# Patient Record
Sex: Female | Born: 1950 | Hispanic: No | Marital: Married | Smoking: Never smoker
Health system: Southern US, Community
[De-identification: ages and names within clinical notes are randomized; demographics above are authoritative.]

## PROBLEM LIST (undated history)

## (undated) DIAGNOSIS — M1990 Unspecified osteoarthritis, unspecified site: Secondary | ICD-10-CM

## (undated) DIAGNOSIS — T7840XA Allergy, unspecified, initial encounter: Secondary | ICD-10-CM

## (undated) DIAGNOSIS — C541 Malignant neoplasm of endometrium: Secondary | ICD-10-CM

## (undated) DIAGNOSIS — Z86010 Personal history of colonic polyps: Secondary | ICD-10-CM

## (undated) HISTORY — DX: Personal history of colonic polyps: Z86.010

## (undated) HISTORY — PX: ABDOMINAL HYSTERECTOMY: SHX81

## (undated) HISTORY — DX: Allergy, unspecified, initial encounter: T78.40XA

## (undated) HISTORY — PX: TOTAL VAGINAL HYSTERECTOMY: SHX2548

## (undated) HISTORY — DX: Malignant neoplasm of endometrium: C54.1

## (undated) HISTORY — DX: Unspecified osteoarthritis, unspecified site: M19.90

---

## 2009-04-26 ENCOUNTER — Ambulatory Visit: Payer: Self-pay | Admitting: Internal Medicine

## 2009-04-26 ENCOUNTER — Other Ambulatory Visit: Admission: RE | Admit: 2009-04-26 | Discharge: 2009-04-26 | Payer: Self-pay | Admitting: Internal Medicine

## 2009-05-28 ENCOUNTER — Ambulatory Visit: Admission: RE | Admit: 2009-05-28 | Discharge: 2009-05-28 | Payer: Self-pay | Admitting: Gynecology

## 2010-12-29 ENCOUNTER — Encounter: Payer: Self-pay | Admitting: Internal Medicine

## 2010-12-29 ENCOUNTER — Ambulatory Visit (INDEPENDENT_AMBULATORY_CARE_PROVIDER_SITE_OTHER): Payer: BC Managed Care – PPO | Admitting: Internal Medicine

## 2010-12-29 VITALS — BP 121/68 | HR 72 | Temp 98.9°F | Ht 63.0 in | Wt 214.0 lb

## 2010-12-29 DIAGNOSIS — Z131 Encounter for screening for diabetes mellitus: Secondary | ICD-10-CM

## 2010-12-29 DIAGNOSIS — Z0000 Encounter for general adult medical examination without abnormal findings: Secondary | ICD-10-CM

## 2010-12-29 DIAGNOSIS — E669 Obesity, unspecified: Secondary | ICD-10-CM

## 2010-12-29 DIAGNOSIS — Z23 Encounter for immunization: Secondary | ICD-10-CM

## 2010-12-29 DIAGNOSIS — C55 Malignant neoplasm of uterus, part unspecified: Secondary | ICD-10-CM

## 2010-12-29 LAB — POCT URINALYSIS DIPSTICK
Ketones, UA: NEGATIVE
Leukocytes, UA: NEGATIVE
Protein, UA: NEGATIVE
Urobilinogen, UA: NEGATIVE

## 2010-12-29 LAB — CBC WITH DIFFERENTIAL/PLATELET
Basophils Relative: 0 % (ref 0–1)
Hemoglobin: 14.7 g/dL (ref 12.0–15.0)
Lymphocytes Relative: 28 % (ref 12–46)
Lymphs Abs: 1.3 10*3/uL (ref 0.7–4.0)
Monocytes Relative: 9 % (ref 3–12)
Neutro Abs: 2.9 10*3/uL (ref 1.7–7.7)
Neutrophils Relative %: 60 % (ref 43–77)
RBC: 4.67 MIL/uL (ref 3.87–5.11)
WBC: 4.8 10*3/uL (ref 4.0–10.5)

## 2010-12-29 LAB — COMPREHENSIVE METABOLIC PANEL
Albumin: 4.4 g/dL (ref 3.5–5.2)
Alkaline Phosphatase: 90 U/L (ref 39–117)
CO2: 28 mEq/L (ref 19–32)
Chloride: 105 mEq/L (ref 96–112)
Glucose, Bld: 87 mg/dL (ref 70–99)
Potassium: 4.1 mEq/L (ref 3.5–5.3)
Sodium: 142 mEq/L (ref 135–145)
Total Protein: 7.1 g/dL (ref 6.0–8.3)

## 2010-12-29 LAB — LIPID PANEL
Cholesterol: 165 mg/dL (ref 0–200)
VLDL: 21 mg/dL (ref 0–40)

## 2010-12-29 MED ORDER — TETANUS-DIPHTH-ACELL PERTUSSIS 5-2.5-18.5 LF-MCG/0.5 IM SUSP
0.5000 mL | Freq: Once | INTRAMUSCULAR | Status: AC
  Administered 2010-12-29: 0.5 mL via INTRAMUSCULAR

## 2010-12-29 NOTE — Progress Notes (Signed)
  Subjective:    Patient ID: Audrey Yoder, female    DOB: May 07, 1951, 60 y.o.   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 2 pregnancies. Family history remarkable for maternal and with ovarian cancer. Patient had abnormal vaginal bleeding for at least one year before seeking attention. Previous weight November 2010 was 227 pounds she's lost proximally 13 pounds. Subsequently underwent a modified radical hysterectomy with bilateral salpingo-oophorectomy and bilateral lymph node dissection 06/21/2009 by Dr. Conley Rolls at [facility]. Pathology showed grade 2 histology, negative nodes, 86% depth of invasion of the uterine wall. Overall her surgical staging was I-B. patient now here for general physical examination. Continues to be followed at [facility] for GYN care. Patient says it's been about 10 years since she last had a [facility] Have asked her to call and check on that. With regard to health maintenance, we gave her a DTaP and the shingles vaccine today. Says she has had a recent mammogram. Last one on file here in this office was from July 2011.    Review of Systems  Constitutional: Negative.   HENT: Negative.   Eyes: Negative.   Respiratory: Negative.   Cardiovascular: Negative.   Gastrointestinal: Negative.   Genitourinary: Negative.   Musculoskeletal: Negative.   Neurological: Negative.   Hematological: Negative.   Psychiatric/Behavioral: Negative.        Objective:   Physical Exam  Constitutional: She is oriented to person, place, and time. She appears well-nourished. No distress.  HENT:  Head: Normocephalic and atraumatic.  Right Ear: External ear normal.  Left Ear: External ear normal.  Mouth/Throat: Oropharynx is clear and moist. No oropharyngeal exudate.  Eyes: EOM are  normal. Pupils are equal, round, and reactive to light. Right eye exhibits no discharge. Left eye exhibits no discharge. No scleral icterus.  Neck: Normal range of motion. Neck supple. No JVD present. No thyromegaly present.  Cardiovascular: Normal rate, regular rhythm and normal heart sounds.   No murmur heard. Pulmonary/Chest: Effort normal and breath sounds normal. No respiratory distress. She has no wheezes. She has no rales. She exhibits no tenderness.       Breasts without masses  Abdominal: Soft. Bowel sounds are normal. She exhibits no distension and no mass. There is no tenderness. There is no rebound and no guarding.  Musculoskeletal: She exhibits no edema.  Lymphadenopathy:    She has no cervical adenopathy.  Neurological: She is alert and oriented to person, place, and time. She has normal reflexes. She displays normal reflexes. No cranial nerve deficit. She exhibits normal muscle tone. Coordination normal.  Skin:       Insect bites lower legs  Psychiatric: She has a normal mood and affect. Her behavior is normal. Judgment and thought content normal.          Assessment & Plan:  History of stage I-B uterine cancer status post surgery January 2011 followed by GYN oncology at [facility]  Obesity  Health maintenance : DTaP and Zostavax vaccines given today. Patient needs to have repeat colonoscopy and will consider it. Return one year or as needed pending review of fasting lab work

## 2010-12-29 NOTE — Patient Instructions (Signed)
Please try the diet exercise and lose some weight.

## 2011-01-06 ENCOUNTER — Encounter: Payer: Self-pay | Admitting: Internal Medicine

## 2011-03-31 ENCOUNTER — Encounter: Payer: Self-pay | Admitting: Internal Medicine

## 2011-04-04 DIAGNOSIS — C549 Malignant neoplasm of corpus uteri, unspecified: Secondary | ICD-10-CM | POA: Insufficient documentation

## 2011-08-02 ENCOUNTER — Encounter: Payer: Self-pay | Admitting: Internal Medicine

## 2011-09-08 ENCOUNTER — Ambulatory Visit (INDEPENDENT_AMBULATORY_CARE_PROVIDER_SITE_OTHER): Payer: BC Managed Care – PPO | Admitting: Internal Medicine

## 2011-09-08 VITALS — BP 142/78 | HR 74 | Temp 97.8°F | Resp 16 | Wt 190.0 lb

## 2011-09-08 DIAGNOSIS — H6593 Unspecified nonsuppurative otitis media, bilateral: Secondary | ICD-10-CM

## 2011-09-08 DIAGNOSIS — J329 Chronic sinusitis, unspecified: Secondary | ICD-10-CM

## 2011-09-08 DIAGNOSIS — H6590 Unspecified nonsuppurative otitis media, unspecified ear: Secondary | ICD-10-CM

## 2011-09-09 ENCOUNTER — Encounter: Payer: Self-pay | Admitting: Internal Medicine
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
## 2011-09-09 NOTE — Patient Instructions (Addendum)
Take Levaquin 500 milligrams daily with a meal for 10 days. Call if not better in 10 days or sooner if worse.

## 2011-09-19 ENCOUNTER — Telehealth: Payer: Self-pay | Admitting: Internal Medicine

## 2011-09-19 DIAGNOSIS — H6690 Otitis media, unspecified, unspecified ear: Secondary | ICD-10-CM

## 2011-09-19 MED ORDER — AZITHROMYCIN 250 MG PO TABS: ORAL_TABLET | ORAL | Status: AC

## 2011-09-19 NOTE — Telephone Encounter (Signed)
Call in Zithromax Z Pak (6 tabs) Sig: Two po day one followed by on po days 2-5. See if not better in one week. Says previous antibiotic caused joint pain.

## 2011-12-22 ENCOUNTER — Other Ambulatory Visit: Payer: Self-pay | Admitting: Internal Medicine

## 2011-12-22 ENCOUNTER — Other Ambulatory Visit: Payer: BC Managed Care – PPO | Admitting: Internal Medicine

## 2011-12-22 DIAGNOSIS — Z0000 Encounter for general adult medical examination without abnormal findings: Secondary | ICD-10-CM

## 2011-12-22 DIAGNOSIS — Z8543 Personal history of malignant neoplasm of ovary: Secondary | ICD-10-CM

## 2011-12-22 LAB — CBC WITH DIFFERENTIAL/PLATELET
Basophils Absolute: 0 10*3/uL (ref 0.0–0.1)
Eosinophils Relative: 3 % (ref 0–5)
HCT: 40.9 % (ref 36.0–46.0)
Lymphocytes Relative: 25 % (ref 12–46)
MCHC: 35 g/dL (ref 30.0–36.0)
MCV: 88.1 fL (ref 78.0–100.0)
Monocytes Absolute: 0.3 10*3/uL (ref 0.1–1.0)
RDW: 13.2 % (ref 11.5–15.5)
WBC: 4.1 10*3/uL (ref 4.0–10.5)

## 2011-12-23 LAB — LIPID PANEL
Cholesterol: 160 mg/dL (ref 0–200)
HDL: 43 mg/dL (ref >39)
Total CHOL/HDL Ratio: 3.7 Ratio
Triglycerides: 83 mg/dL (ref <150)

## 2011-12-23 LAB — TSH: TSH: 1.603 u[IU]/mL (ref 0.350–4.500)

## 2011-12-23 LAB — COMPREHENSIVE METABOLIC PANEL
AST: 19 U/L (ref 0–37)
BUN: 17 mg/dL (ref 6–23)
Calcium: 8.9 mg/dL (ref 8.4–10.5)
Chloride: 106 mEq/L (ref 96–112)
Creat: 0.68 mg/dL (ref 0.50–1.10)
Glucose, Bld: 79 mg/dL (ref 70–99)

## 2011-12-25 ENCOUNTER — Ambulatory Visit (INDEPENDENT_AMBULATORY_CARE_PROVIDER_SITE_OTHER): Payer: BC Managed Care – PPO | Admitting: Internal Medicine

## 2011-12-25 VITALS — BP 130/72 | HR 76 | Temp 99.1°F | Ht 64.25 in | Wt 208.0 lb

## 2011-12-25 DIAGNOSIS — E7800 Pure hypercholesterolemia, unspecified: Secondary | ICD-10-CM

## 2011-12-25 DIAGNOSIS — Z87898 Personal history of other specified conditions: Secondary | ICD-10-CM

## 2011-12-25 DIAGNOSIS — Z8542 Personal history of malignant neoplasm of other parts of uterus: Secondary | ICD-10-CM

## 2011-12-25 DIAGNOSIS — Z0000 Encounter for general adult medical examination without abnormal findings: Secondary | ICD-10-CM

## 2011-12-25 DIAGNOSIS — Z8639 Personal history of other endocrine, nutritional and metabolic disease: Secondary | ICD-10-CM

## 2011-12-25 DIAGNOSIS — E669 Obesity, unspecified: Secondary | ICD-10-CM

## 2011-12-25 LAB — POCT URINALYSIS DIPSTICK
Bilirubin, UA: NEGATIVE
Glucose, UA: NEGATIVE
Ketones, UA: NEGATIVE
Spec Grav, UA: >=1.03

## 2011-12-25 LAB — HEMOGLOBIN A1C: Mean Plasma Glucose: 108 mg/dL (ref <117)

## 2012-01-13 ENCOUNTER — Encounter: Payer: Self-pay | Admitting: Internal Medicine

## 2012-01-13 DIAGNOSIS — E7800 Pure hypercholesterolemia, unspecified: Secondary | ICD-10-CM | POA: Insufficient documentation

## 2012-01-13 DIAGNOSIS — Z8639 Personal history of other endocrine, nutritional and metabolic disease: Secondary | ICD-10-CM | POA: Insufficient documentation

## 2012-01-13 DIAGNOSIS — Z8542 Personal history of malignant neoplasm of other parts of uterus: Secondary | ICD-10-CM | POA: Insufficient documentation

## 2012-01-13 NOTE — Progress Notes (Signed)
Subjective:    Patient ID: Audrey Yoder, female    DOB: [birth year], 61 y.o.   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 She has a history of uterine cancer. Patient underwent a modified radical hysterectomy with BSO and bilateral pelvic lymph node dissection 06/21/2009 by Dr. Conley Rolls, GYN oncologist at [facility]. Pathology showed grade 2 histology, negative nodes in an 86% depth of invasion of the uterine wall. Overall her surgical staging was 1 be. Patient had vaginal cuff brachii therapy 3 doses one month apart as adjuvant therapy. Continues to be followed at [facility].  Patient had Zostavax vaccine 12/29/2010 and Tdap Vaccine 12/29/2010. She is allergic to sulfa. History of left knee osteoarthritis 2008 with steroid injection number orthopedist. Patient says she had a colonoscopy or sigmoidoscopy perhaps in 2003 at [facility]. However, they told us by phone they did not have anything on file from a number of years ago.  Social history: She has a JD degree and is married. She is self-employed. Does not smoke. Drinks wine infrequently. Has 2 adult sons.  Family history: Father age 84 with history of myeloproliferative disorder. Mother age 70 with history of stroke. One brother with history of hemochromatosis.  Patient has history of vitamin D deficiency in 2010. Her level at that time was 11. She was placed on high-dose vitamin D for 12 weeks and advised take 2000 units daily. History of LDL cholesterol level of 127 in 2010. Patient had mammogram July 2011 which was negative.      Review of Systems  Constitutional: Negative.   HENT: Negative.   Eyes: Negative.   Respiratory: Negative.   Cardiovascular: Negative.   Gastrointestinal: Negative.   Genitourinary: Negative.   Musculoskeletal: Negative.   Neurological: Negative.   Hematological: Negative.   Psychiatric/Behavioral: Negative.          Objective:   Physical Exam  Vitals reviewed. Constitutional: She is oriented to person, place, and time. She appears well-developed and well-nourished. No distress.  HENT:  Head: Normocephalic and atraumatic.  Right Ear: External ear normal.  Left Ear: External ear normal.  Mouth/Throat: Oropharynx is clear and moist. No oropharyngeal exudate.  Eyes: Conjunctivae and EOM are normal. Pupils are equal, round, and reactive to light. Right eye exhibits no discharge. Left eye exhibits no discharge. No scleral icterus.  Neck: Neck supple. No JVD present. No thyromegaly present.  Cardiovascular: Normal rate, regular rhythm, normal heart sounds and intact distal pulses.   No murmur heard. Pulmonary/Chest: Effort normal and breath sounds normal. No respiratory distress. She has no wheezes. She has no rales.       Breasts normal female  Abdominal: Soft. Bowel sounds are normal. She exhibits no distension and no mass. There is no tenderness. There is no rebound.  Genitourinary:       Deferred to GYN oncology  Musculoskeletal: Normal range of motion. She exhibits no edema.  Lymphadenopathy:    She has no cervical adenopathy.  Neurological: She is alert and oriented to person, place, and time. No cranial nerve deficit. Coordination normal.  Skin: Skin is warm and dry. She is not diaphoretic.  Psychiatric: She has a normal mood and affect. Her behavior is normal. Judgment and thought content normal.          Assessment & Plan:  History of uterine cancer status post hysterectomy BSO and lymph node dissection at [facility] by Dr.  Jamal Collin oncologist 2011  History of moderately elevated LDL  History of obesity  History of vitamin D deficiency  Plan: Reminded patient regarding colonoscopy. Given 3 Hemoccult cards and asked her to send these back if she does not want to do colonoscopy at the present time. Return in one year or as needed. Reminded regarding annual mammogram.

## 2012-01-13 NOTE — Patient Instructions (Addendum)
Continue calcium and vitamin D supplement. Return in one year. Try the diet exercise and lose weight. Consider colonoscopy. Reminded re: annual mammogram

## 2012-03-25 ENCOUNTER — Encounter: Payer: Self-pay | Admitting: Internal Medicine

## 2012-12-06 ENCOUNTER — Telehealth: Payer: Self-pay

## 2012-12-06 NOTE — Telephone Encounter (Signed)
Patient states she thinks she may have an URI because she is achy and has a sore throat, but then also remembers she had a tick bite approx 30 days ago. Says it was a small deer tick. Thinking she may have Lyme disease. Advised [facility] today, since Dr. Lenord Fellers is out of the office. She is agreeable

## 2013-01-14 ENCOUNTER — Other Ambulatory Visit: Payer: BC Managed Care – PPO | Admitting: Internal Medicine

## 2013-01-14 DIAGNOSIS — Z13220 Encounter for screening for lipoid disorders: Secondary | ICD-10-CM

## 2013-01-14 DIAGNOSIS — Z1329 Encounter for screening for other suspected endocrine disorder: Secondary | ICD-10-CM

## 2013-01-14 DIAGNOSIS — Z130 Encounter for screening for diseases of the blood and blood-forming organs and certain disorders involving the immune mechanism: Secondary | ICD-10-CM

## 2013-01-14 DIAGNOSIS — E559 Vitamin D deficiency, unspecified: Secondary | ICD-10-CM

## 2013-01-14 DIAGNOSIS — Z0000 Encounter for general adult medical examination without abnormal findings: Secondary | ICD-10-CM

## 2013-01-14 LAB — CBC WITH DIFFERENTIAL/PLATELET
Basophils Absolute: 0 10*3/uL (ref 0.0–0.1)
Eosinophils Relative: 3 % (ref 0–5)
HCT: 41.1 % (ref 36.0–46.0)
Lymphocytes Relative: 25 % (ref 12–46)
Lymphs Abs: 1.1 10*3/uL (ref 0.7–4.0)
MCV: 88.4 fL (ref 78.0–100.0)
Monocytes Absolute: 0.5 10*3/uL (ref 0.1–1.0)
Neutro Abs: 2.8 10*3/uL (ref 1.7–7.7)
Platelets: 248 10*3/uL (ref 150–400)
RBC: 4.65 MIL/uL (ref 3.87–5.11)
RDW: 13.6 % (ref 11.5–15.5)
WBC: 4.5 10*3/uL (ref 4.0–10.5)

## 2013-01-14 LAB — COMPREHENSIVE METABOLIC PANEL
ALT: 18 U/L (ref 0–35)
AST: 15 U/L (ref 0–37)
Albumin: 4.2 g/dL (ref 3.5–5.2)
CO2: 29 mEq/L (ref 19–32)
Calcium: 9.2 mg/dL (ref 8.4–10.5)
Chloride: 107 mEq/L (ref 96–112)
Creat: 0.7 mg/dL (ref 0.50–1.10)
Potassium: 4.1 mEq/L (ref 3.5–5.3)

## 2013-01-14 LAB — LIPID PANEL: Cholesterol: 174 mg/dL (ref 0–200)

## 2013-01-14 LAB — TSH: TSH: 1.193 u[IU]/mL (ref 0.350–4.500)

## 2013-01-15 LAB — VITAMIN D 25 HYDROXY (VIT D DEFICIENCY, FRACTURES): Vit D, 25-Hydroxy: 48 ng/mL (ref 30–89)

## 2013-01-16 ENCOUNTER — Other Ambulatory Visit: Payer: Self-pay | Admitting: Internal Medicine

## 2013-01-17 ENCOUNTER — Encounter: Payer: Self-pay | Admitting: Internal Medicine

## 2013-01-17 ENCOUNTER — Ambulatory Visit (INDEPENDENT_AMBULATORY_CARE_PROVIDER_SITE_OTHER): Payer: BC Managed Care – PPO | Admitting: Internal Medicine

## 2013-01-17 VITALS — BP 126/76 | HR 84 | Ht 62.5 in | Wt 222.0 lb

## 2013-01-17 DIAGNOSIS — Z8542 Personal history of malignant neoplasm of other parts of uterus: Secondary | ICD-10-CM

## 2013-01-17 DIAGNOSIS — E669 Obesity, unspecified: Secondary | ICD-10-CM

## 2013-01-17 DIAGNOSIS — G5710 Meralgia paresthetica, unspecified lower limb: Secondary | ICD-10-CM

## 2013-01-17 DIAGNOSIS — G5712 Meralgia paresthetica, left lower limb: Secondary | ICD-10-CM

## 2013-01-17 DIAGNOSIS — E785 Hyperlipidemia, unspecified: Secondary | ICD-10-CM

## 2013-01-17 DIAGNOSIS — N390 Urinary tract infection, site not specified: Secondary | ICD-10-CM

## 2013-01-17 DIAGNOSIS — Z0000 Encounter for general adult medical examination without abnormal findings: Secondary | ICD-10-CM

## 2013-01-17 DIAGNOSIS — Z8639 Personal history of other endocrine, nutritional and metabolic disease: Secondary | ICD-10-CM

## 2013-01-17 LAB — POCT URINALYSIS DIPSTICK
Bilirubin, UA: NEGATIVE
Glucose, UA: NEGATIVE
Nitrite, UA: NEGATIVE
Urobilinogen, UA: NEGATIVE

## 2013-01-17 NOTE — Patient Instructions (Addendum)
It was a pleasure to see you. Return in one year. Diet exercise and lose weight. Please have colonoscopy and mammogram.

## 2013-01-18 ENCOUNTER — Encounter: Payer: Self-pay | Admitting: Internal Medicine

## 2013-01-18 NOTE — Progress Notes (Signed)
Subjective:    Patient ID: Audrey Yoder, female    DOB: 08-04-1950, 62 y.o.   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 History of uterine cancer. Patient underwent a modified radical hysterectomy with BSO and bilateral pelvic lymph node dissection January 2011 at [facility]. Pathology showed grade 2 histology. Had negative nodes and 86% depth invasion of the uterine wall. Overall her surgery staging was 1-B. Patient had vaginal cuff brachii therapy 3 doses one month apart his adjuvant therapy. Continues to be followed at [facility].  Had Zostavax vaccine July 2012, tetanus vaccine July 2012.  She is allergic to Sulfa.  Patient says she had colonoscopy or sigmoidoscopy around 2003 at [facility] but we have not been able to locate those records. However we do see  In EPIC a recall letter from Dr. Lina Sar dated February 2013  History of left knee osteoarthritis 2008 with steroid injection by orthopedist.  Social history: She has a JD degree and is married. She is self-employed. Does not smoke. Drinks wine infrequently. Has 2 adult sons.  History of vitamin D deficiency in 2010. At that time her level was 11. History of LDL cholesterol of 127 in 4098.  Patient had mammogram July 2011 which was negative.   Family history: Father with history of myeloproliferative disorder. Mother with history of stroke. One brother with history of hemochromatosis.  Recently patient was treated at an urgent care center in [place] for a presumed tickborne illness with doxycycline. Patient had flulike symptoms and reportedly had a recent tick bite.     Review of Systems  Constitutional: Negative.   HENT: Negative.   Eyes: Negative.   Respiratory: Negative.   Cardiovascular: Negative.   Gastrointestinal: Negative.   Endocrine: Negative.   Genitourinary: Negative.   Allergic/Immunologic: Negative.    Neurological:       Complaining of some numbness and tingling left lateral leg with prolonged standing  Hematological: Negative.   Psychiatric/Behavioral: Negative.        Objective:   Physical Exam  Vitals reviewed. Constitutional: She is oriented to person, place, and time. She appears well-developed and well-nourished. No distress.  HENT:  Head: Normocephalic and atraumatic.  Right Ear: External ear normal.  Left Ear: External ear normal.  Mouth/Throat: Oropharynx is clear and moist. No oropharyngeal exudate.  Eyes: Conjunctivae and EOM are normal. Pupils are equal, round, and reactive to light. Right eye exhibits no discharge. Left eye exhibits no discharge. No scleral icterus.  Neck: Neck supple. No JVD present. No tracheal deviation present. No thyromegaly present.  Cardiovascular: Normal rate, regular rhythm, normal heart sounds and intact distal pulses.   No murmur heard. Pulmonary/Chest: Effort normal and breath sounds normal. No respiratory distress. She has no wheezes. She has no rales. She exhibits no tenderness.  Breasts normal female  Abdominal: Soft. Bowel sounds are normal. She exhibits no distension and no mass. There is no tenderness. There is no rebound and no guarding.  Genitourinary:  Deferred to [facility]YN oncology  Musculoskeletal: Normal range of motion. She exhibits no edema.  Lymphadenopathy:    She has no cervical adenopathy.  Neurological: She is alert and oriented to person, place, and time. She has normal reflexes. No cranial nerve deficit. Coordination normal.  Skin: Skin is warm and dry. No rash noted. She is not diaphoretic.  Psychiatric: She has a normal mood and affect. Her behavior is normal. Judgment and  thought content normal.          Assessment & Plan:  Obesity-talk with patient about diet and exercise  Numbness left lateral leg consistent with meralgia paresthetica  Hyperlipidemia-recommend diet and exercise.  Recently  treated for presumed tickborne illness with doxycycline-do not have records on this visit to urgent care in [place]  History of uterine cancer-followed at [facility]  History of vitamin D deficiency  Abnormal urinalysis-urine culture done. Patient asymptomatic.  Plan: Return in one year or as needed. Reassure her regarding benign condition with numbness left lateral leg involving lateral  femoral cutaneous nerve with prolonged standing  Reminded about mammogram and colonoscopy. Last mammogram on file 2012. Suggest bone density study. This

## 2013-01-19 LAB — URINE CULTURE: Colony Count: 75000

## 2013-01-20 NOTE — Progress Notes (Signed)
Patient informed,

## 2013-03-04 ENCOUNTER — Ambulatory Visit (INDEPENDENT_AMBULATORY_CARE_PROVIDER_SITE_OTHER): Payer: BC Managed Care – PPO | Admitting: Internal Medicine

## 2013-03-04 ENCOUNTER — Encounter: Payer: Self-pay | Admitting: Internal Medicine

## 2013-03-04 VITALS — BP 134/76 | Temp 99.1°F | Wt 222.0 lb

## 2013-03-04 DIAGNOSIS — M67442 Ganglion, left hand: Secondary | ICD-10-CM

## 2013-03-04 DIAGNOSIS — M6740 Ganglion, unspecified site: Secondary | ICD-10-CM

## 2013-03-04 NOTE — Patient Instructions (Addendum)
Call us with your schedule and availability. Appointment will be made after we hear back from you when you may be available to see consultant.

## 2013-03-04 NOTE — Progress Notes (Signed)
  Subjective:    Patient ID: Audrey Yoder, female    DOB: 03-Feb-1951, 62 y.o.   MRN: [id number]  HPI   Patient recently noticed a tender nodule left thumb MCP joint. No injury. She is actually right-handed. However she says she's noticed pain when she attempts to pick up something with her left hand. She is concerned about cancer. History of endometrial cancer.    Review of Systems     Objective:   Physical Exam  Appears to have cystic lesion lateral aspect left thumb MCP joint. Good range of motion left MCP joint        Assessment & Plan:   ? Ganglion cyst left MCP joint versus other cystic lesion  Plan: Appointment with Dr. Teressa Senter for evaluation. Unlikely this is malignancy.

## 2013-03-10 ENCOUNTER — Telehealth: Payer: Self-pay

## 2013-03-10 NOTE — Telephone Encounter (Signed)
Patient scheduled for an appointment with Dr.Sypher on 03/17/2013 at 8:30 am. She has been notified of this .

## 2014-01-02 ENCOUNTER — Other Ambulatory Visit: Payer: BC Managed Care – PPO | Admitting: Internal Medicine

## 2014-01-05 ENCOUNTER — Encounter: Payer: BC Managed Care – PPO | Admitting: Internal Medicine

## 2014-02-12 ENCOUNTER — Encounter: Payer: Self-pay | Admitting: Internal Medicine

## 2014-02-12 ENCOUNTER — Ambulatory Visit (INDEPENDENT_AMBULATORY_CARE_PROVIDER_SITE_OTHER): Payer: BC Managed Care – PPO | Admitting: Internal Medicine

## 2014-02-12 VITALS — BP 130/80 | HR 76 | Temp 99.2°F | Wt 223.0 lb

## 2014-02-12 DIAGNOSIS — Z0130 Encounter for examination of blood pressure without abnormal findings: Secondary | ICD-10-CM

## 2014-02-12 DIAGNOSIS — Z136 Encounter for screening for cardiovascular disorders: Secondary | ICD-10-CM

## 2014-02-12 NOTE — Progress Notes (Signed)
   Subjective:    Patient ID: Audrey Yoder, female    DOB: [birth year], 63 y.o.   MRN: [id number]  HPI  Patient recently went to have an insurance physical and was told her blood pressure was borderline elevated. She says she is able to appeal that reading if we with take it here today and fax it to insurance company. She has not had physical exam this year. Reminded her she has not had physical exam here this year. She wants  to wait until 2016 to have physical exam.She says when she takes her blood pressure at home with husband's cuff  that it has been normal. She has no prior history of hypertension.    Review of Systems     Objective:   Physical Exam Blood pressure today is 130/80 right arm large cuff       Assessment & Plan:  Blood pressure check  Plan: Fax blood pressure reading to [facility] at [phone]  on letterhead as  patient requested

## 2014-02-12 NOTE — Patient Instructions (Addendum)
Schedule CPE for 2016.  Patient requests  that note be faxed to insurance company regarding blood pressure reading.

## 2014-06-15 ENCOUNTER — Ambulatory Visit (INDEPENDENT_AMBULATORY_CARE_PROVIDER_SITE_OTHER): Payer: BC Managed Care – PPO | Admitting: Internal Medicine

## 2014-06-15 ENCOUNTER — Encounter: Payer: Self-pay | Admitting: Internal Medicine

## 2014-06-15 VITALS — BP 140/76 | HR 84 | Temp 98.4°F | Wt 208.0 lb

## 2014-06-15 DIAGNOSIS — N200 Calculus of kidney: Secondary | ICD-10-CM

## 2014-06-15 DIAGNOSIS — R350 Frequency of micturition: Secondary | ICD-10-CM

## 2014-06-15 DIAGNOSIS — Q615 Medullary cystic kidney: Secondary | ICD-10-CM

## 2014-06-15 LAB — BASIC METABOLIC PANEL
BUN: 18 mg/dL (ref 6–23)
CALCIUM: 9 mg/dL (ref 8.4–10.5)
CO2: 24 mEq/L (ref 19–32)
CREATININE: 0.67 mg/dL (ref 0.50–1.10)
Chloride: 105 mEq/L (ref 96–112)
GLUCOSE: 83 mg/dL (ref 70–99)
Potassium: 4.3 mEq/L (ref 3.5–5.3)
Sodium: 140 mEq/L (ref 135–145)

## 2014-06-15 LAB — POCT URINALYSIS DIPSTICK
BILIRUBIN UA: NEGATIVE
GLUCOSE UA: NEGATIVE
KETONES UA: NEGATIVE
LEUKOCYTES UA: NEGATIVE
NITRITE UA: NEGATIVE
Protein, UA: NEGATIVE
Spec Grav, UA: 1.01
Urobilinogen, UA: NEGATIVE
pH, UA: 6
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
## 2014-06-15 NOTE — Patient Instructions (Signed)
Have CT with stone protocol in the near future. Apply ice for 15 minutes daily to neck as needed for neck pain.

## 2014-06-18 ENCOUNTER — Telehealth: Payer: Self-pay | Admitting: *Deleted

## 2014-06-18 DIAGNOSIS — N200 Calculus of kidney: Secondary | ICD-10-CM

## 2014-06-18 NOTE — Telephone Encounter (Signed)
Scheduled patient CT Scan Abdomen/Pelvis W/

## 2014-06-18 NOTE — Telephone Encounter (Signed)
Patient notified regarding CT appt time

## 2014-06-22 ENCOUNTER — Telehealth: Payer: Self-pay | Admitting: *Deleted

## 2014-06-22 LAB — STONE ANALYSIS: Stone Weight KSTONE: 0.019 g

## 2014-06-22 NOTE — Telephone Encounter (Signed)
Reviewed lab results with patient she will come in January 11th 2016 for serum uric acid level to be drawn

## 2014-06-26 ENCOUNTER — Telehealth: Payer: Self-pay | Admitting: *Deleted

## 2014-06-26 NOTE — Telephone Encounter (Signed)
Called patient insurance company for prior authorization for scheduled CT abdo/pelvis scheduled on Jan 11.2016 Authorization number is [id number] [address] called [place] and gave them auth number

## 2014-06-29 ENCOUNTER — Telehealth: Payer: Self-pay | Admitting: *Deleted

## 2014-06-29 ENCOUNTER — Other Ambulatory Visit: Payer: BLUE CROSS/BLUE SHIELD | Admitting: Internal Medicine

## 2014-06-29 ENCOUNTER — Ambulatory Visit
Admission: RE | Admit: 2014-06-29 | Discharge: 2014-06-29 | Disposition: A | Discharge status: Home / Self Care | Payer: BLUE CROSS/BLUE SHIELD | Source: Ambulatory Visit | Attending: Internal Medicine | Admitting: Internal Medicine

## 2014-06-29 DIAGNOSIS — N200 Calculus of kidney: Secondary | ICD-10-CM

## 2014-06-29 LAB — URIC ACID: Uric Acid, Serum: 6.9 mg/dL (ref 2.4–7.0)

## 2014-06-29 NOTE — Telephone Encounter (Signed)
Patient returned call reviewed CT results with patient

## 2014-06-30 ENCOUNTER — Telehealth: Payer: Self-pay | Admitting: *Deleted

## 2014-06-30 ENCOUNTER — Encounter: Payer: Self-pay | Admitting: Internal Medicine

## 2014-07-02 NOTE — Telephone Encounter (Signed)
Reviewed lab results with patient.

## 2014-07-03 ENCOUNTER — Encounter: Payer: Self-pay | Admitting: Internal Medicine

## 2014-10-06 ENCOUNTER — Encounter: Payer: Self-pay | Admitting: Internal Medicine

## 2014-10-23 ENCOUNTER — Other Ambulatory Visit: Payer: BLUE CROSS/BLUE SHIELD | Admitting: Internal Medicine

## 2014-10-23 DIAGNOSIS — Z1321 Encounter for screening for nutritional disorder: Secondary | ICD-10-CM

## 2014-10-23 DIAGNOSIS — Z130 Encounter for screening for diseases of the blood and blood-forming organs and certain disorders involving the immune mechanism: Secondary | ICD-10-CM

## 2014-10-23 DIAGNOSIS — Z13220 Encounter for screening for lipoid disorders: Secondary | ICD-10-CM

## 2014-10-23 DIAGNOSIS — Z0000 Encounter for general adult medical examination without abnormal findings: Secondary | ICD-10-CM

## 2014-10-23 DIAGNOSIS — Z131 Encounter for screening for diabetes mellitus: Secondary | ICD-10-CM

## 2014-10-23 DIAGNOSIS — Z1329 Encounter for screening for other suspected endocrine disorder: Secondary | ICD-10-CM

## 2014-10-23 LAB — LIPID PANEL
CHOL/HDL RATIO: 3.4 ratio
CHOLESTEROL: 138 mg/dL (ref 0–200)
HDL: 41 mg/dL — AB (ref >=46)
LDL Cholesterol: 83 mg/dL (ref 0–99)
Triglycerides: 72 mg/dL (ref <150)
VLDL: 14 mg/dL (ref 0–40)

## 2014-10-23 LAB — CBC WITH DIFFERENTIAL/PLATELET
BASOS ABS: 0 10*3/uL (ref 0.0–0.1)
BASOS PCT: 0 % (ref 0–1)
EOS ABS: 0.1 10*3/uL (ref 0.0–0.7)
Eosinophils Relative: 3 % (ref 0–5)
HEMATOCRIT: 43.1 % (ref 36.0–46.0)
Hemoglobin: 14.5 g/dL (ref 12.0–15.0)
LYMPHS ABS: 1.1 10*3/uL (ref 0.7–4.0)
Lymphocytes Relative: 31 % (ref 12–46)
MCH: 30.7 pg (ref 26.0–34.0)
MCHC: 33.6 g/dL (ref 30.0–36.0)
MCV: 91.1 fL (ref 78.0–100.0)
MONO ABS: 0.3 10*3/uL (ref 0.1–1.0)
MONOS PCT: 9 % (ref 3–12)
MPV: 9.5 fL (ref 8.6–12.4)
Neutro Abs: 2.1 10*3/uL (ref 1.7–7.7)
Neutrophils Relative %: 57 % (ref 43–77)
Platelets: 253 10*3/uL (ref 150–400)
RBC: 4.73 MIL/uL (ref 3.87–5.11)
RDW: 13.4 % (ref 11.5–15.5)
WBC: 3.7 10*3/uL — ABNORMAL LOW (ref 4.0–10.5)

## 2014-10-23 LAB — COMPLETE METABOLIC PANEL WITH GFR
ALT: 18 U/L (ref 0–35)
AST: 14 U/L (ref 0–37)
Albumin: 3.9 g/dL (ref 3.5–5.2)
Alkaline Phosphatase: 80 U/L (ref 39–117)
BUN: 17 mg/dL (ref 6–23)
CO2: 25 mEq/L (ref 19–32)
Calcium: 8.8 mg/dL (ref 8.4–10.5)
Chloride: 106 mEq/L (ref 96–112)
Creat: 0.7 mg/dL (ref 0.50–1.10)
GFR, Est African American: >89 mL/min
GFR, Est Non African American: >89 mL/min
GLUCOSE: 85 mg/dL (ref 70–99)
Potassium: 4.7 mEq/L (ref 3.5–5.3)
Sodium: 142 mEq/L (ref 135–145)
Total Bilirubin: 0.5 mg/dL (ref 0.2–1.2)
Total Protein: 6.6 g/dL (ref 6.0–8.3)

## 2014-10-23 NOTE — Addendum Note (Signed)
Addended by: Leota Jacobsen on: 10/23/2014 09:36 AM   Modules accepted: Orders

## 2014-10-24 LAB — HEMOGLOBIN A1C
HEMOGLOBIN A1C: 5.4 % (ref <5.7)
MEAN PLASMA GLUCOSE: 108 mg/dL (ref <117)

## 2014-10-24 LAB — TSH: TSH: 0.818 u[IU]/mL (ref 0.350–4.500)

## 2014-10-24 LAB — VITAMIN D 25 HYDROXY (VIT D DEFICIENCY, FRACTURES): Vit D, 25-Hydroxy: 39 ng/mL (ref 30–100)

## 2014-10-26 ENCOUNTER — Telehealth: Payer: Self-pay | Admitting: *Deleted

## 2014-10-26 ENCOUNTER — Ambulatory Visit (INDEPENDENT_AMBULATORY_CARE_PROVIDER_SITE_OTHER): Payer: BLUE CROSS/BLUE SHIELD | Admitting: Internal Medicine

## 2014-10-26 ENCOUNTER — Encounter: Payer: Self-pay | Admitting: Internal Medicine

## 2014-10-26 VITALS — BP 128/78 | HR 89 | Temp 98.3°F | Ht 62.0 in | Wt 219.0 lb

## 2014-10-26 DIAGNOSIS — Z6841 Body Mass Index (BMI) 40.0 and over, adult: Secondary | ICD-10-CM

## 2014-10-26 DIAGNOSIS — Z8542 Personal history of malignant neoplasm of other parts of uterus: Secondary | ICD-10-CM | POA: Diagnosis not present

## 2014-10-26 DIAGNOSIS — Z0000 Encounter for general adult medical examination without abnormal findings: Secondary | ICD-10-CM | POA: Diagnosis not present

## 2014-10-26 LAB — POCT URINALYSIS DIPSTICK
BILIRUBIN UA: NEGATIVE
Blood, UA: NEGATIVE
GLUCOSE UA: NEGATIVE
Ketones, UA: NEGATIVE
Leukocytes, UA: NEGATIVE
NITRITE UA: NEGATIVE
Protein, UA: NEGATIVE
UROBILINOGEN UA: NEGATIVE
pH, UA: 5

## 2014-10-26 NOTE — Patient Instructions (Signed)
Encouraged diet and exercise. Return in one year or as needed.

## 2014-10-26 NOTE — Progress Notes (Signed)
Subjective:    Patient ID: Audrey Yoder, female    DOB: May 04, 1951, 64 y.o.   MRN: [id number]  HPI  64 year old White Female in today for health maintenance exam and evaluation of medical issues. Patient was diagnosed with uterine cancer in late 2010. Subsequently she underwent a modified radical hysterectomy with BSO and bilateral lymph node dissection January 2011 at [facility]. Pathology showed grade 2 histology. Had negative nodes and 86 percent depth of invasion of the uterine wall. Overall her surgery staging was 1-be. Patient had vaginal cuff brachii therapy for 3 doses one month apart as adjuvant therapy. This past year was released from follow-up at [facility].  Had Zostavax vaccine July 2012. Tetanus vaccine July 2012.  She is allergic to sulfa.  Patient had colonoscopy or sigmoidoscopy around 2003 at [place] but we have not been able to locate those records. We do see a recall letter from Dr. Delfin Edis in [place] dated February 2013. However, patient refuses to go back for colonoscopy. Is interested in doing Hemoccult cards.  History of left knee osteoarthritis 2008 with steroid injection by orthopedist. History of vitamin D deficiency in 2010. At that time, her level was 11. History of LDL cholesterol of 127 and 2010. In 2014 was treated for presumed tickborne illness and an urgent care center and [place]. Patient had flulike symptoms and reportedly had had a recent tick bite.  Social history: She has a JD degree and is married. She is self-employed. Does not smoke. Drinks wine infrequently. Has 2 adult sons.  Family history: Father with history of myeloproliferative disorder. Mother with history of stroke. One brother with history of hemachromatosis.    Review of Systems  Constitutional: Negative.   HENT: Negative.   Eyes: Negative.   Respiratory: Negative.   Cardiovascular: Negative.   Gastrointestinal: Negative.   Endocrine: Negative.   Neurological:  Negative.   Hematological: Negative.   Psychiatric/Behavioral: Negative.        Objective:   Physical Exam  Constitutional: She is oriented to person, place, and time. She appears well-developed and well-nourished. No distress.  HENT:  Head: Normocephalic and atraumatic.  Right Ear: External ear normal.  Left Ear: External ear normal.  Mouth/Throat: Oropharynx is clear and moist. No oropharyngeal exudate.  Eyes: Conjunctivae and EOM are normal. Pupils are equal, round, and reactive to light. Right eye exhibits no discharge. Left eye exhibits no discharge. No scleral icterus.  Neck: Neck supple. No JVD present. No thyromegaly present.  Cardiovascular: Normal rate, regular rhythm, normal heart sounds and intact distal pulses.   No murmur heard. Pulmonary/Chest: Breath sounds normal. No respiratory distress. She has no wheezes. She has no rales.  Abdominal: Soft. Bowel sounds are normal. She exhibits no distension and no mass. There is no tenderness. There is no rebound and no guarding.  Genitourinary:  Bimanual normal. Pap not taken.  Musculoskeletal: Normal range of motion. She exhibits no edema.  Lymphadenopathy:    She has no cervical adenopathy.  Neurological: She is alert and oriented to person, place, and time. She has normal reflexes. No cranial nerve deficit. Coordination normal.  Skin: Skin is warm and dry. No rash noted. She is not diaphoretic.  Psychiatric: She has a normal mood and affect. Her behavior is normal. Judgment and thought content normal.  Vitals reviewed.         Assessment & Plan:  History of uterine cancer-released from follow-up at [facility] this past year  Normal health maintenance  exam  Obesity  History of meralgia paresthetica-not mentioned today affecting left lateral leg  Plan: Return in one year or as needed. Reminded about annual mammogram. Unwilling to do colonoscopy. She is interested in [place]. Due for Prevnar.

## 2014-11-03 NOTE — Telephone Encounter (Signed)
Patient called to give waist measurement requested we fax her the document so she can send to insurance company faxed to [phone]

## 2015-11-04 ENCOUNTER — Other Ambulatory Visit: Payer: Self-pay | Admitting: Internal Medicine

## 2015-11-04 ENCOUNTER — Other Ambulatory Visit: Payer: Managed Care, Other (non HMO) | Admitting: Internal Medicine

## 2015-11-04 DIAGNOSIS — Z130 Encounter for screening for diseases of the blood and blood-forming organs and certain disorders involving the immune mechanism: Secondary | ICD-10-CM

## 2015-11-04 DIAGNOSIS — Z1329 Encounter for screening for other suspected endocrine disorder: Secondary | ICD-10-CM

## 2015-11-04 DIAGNOSIS — Z0000 Encounter for general adult medical examination without abnormal findings: Secondary | ICD-10-CM

## 2015-11-04 DIAGNOSIS — Z13220 Encounter for screening for lipoid disorders: Secondary | ICD-10-CM

## 2015-11-04 LAB — COMPLETE METABOLIC PANEL WITH GFR
ALBUMIN: 4 g/dL (ref 3.6–5.1)
ALK PHOS: 98 U/L (ref 33–130)
ALT: 25 U/L (ref 6–29)
AST: 19 U/L (ref 10–35)
BILIRUBIN TOTAL: 0.4 mg/dL (ref 0.2–1.2)
BUN: 15 mg/dL (ref 7–25)
CO2: 27 mmol/L (ref 20–31)
Calcium: 8.6 mg/dL (ref 8.6–10.4)
Chloride: 104 mmol/L (ref 98–110)
Creat: 0.69 mg/dL (ref 0.50–0.99)
GFR, Est African American: >89 mL/min (ref >=60)
Glucose, Bld: 86 mg/dL (ref 65–99)
Potassium: 4.1 mmol/L (ref 3.5–5.3)
SODIUM: 141 mmol/L (ref 135–146)
Total Protein: 6.5 g/dL (ref 6.1–8.1)

## 2015-11-04 LAB — CBC WITH DIFFERENTIAL/PLATELET
Basophils Absolute: 0 cells/uL (ref 0–200)
Basophils Relative: 0 %
EOS PCT: 2 %
Eosinophils Absolute: 116 cells/uL (ref 15–500)
HCT: 44.1 % (ref 35.0–45.0)
HEMOGLOBIN: 14.7 g/dL (ref 11.7–15.5)
Lymphocytes Relative: 13 %
Lymphs Abs: 754 cells/uL — ABNORMAL LOW (ref 850–3900)
MCH: 30 pg (ref 27.0–33.0)
MCHC: 33.3 g/dL (ref 32.0–36.0)
MCV: 90 fL (ref 80.0–100.0)
MONOS PCT: 19 %
MPV: 9.6 fL (ref 7.5–12.5)
Monocytes Absolute: 1102 cells/uL — ABNORMAL HIGH (ref 200–950)
Neutro Abs: 3828 cells/uL (ref 1500–7800)
Neutrophils Relative %: 66 %
PLATELETS: 243 10*3/uL (ref 140–400)
RBC: 4.9 MIL/uL (ref 3.80–5.10)
RDW: 13.6 % (ref 11.0–15.0)
WBC: 5.8 10*3/uL (ref 3.8–10.8)

## 2015-11-04 LAB — HEPATIC FUNCTION PANEL
ALT: 25 U/L (ref 6–29)
AST: 19 U/L (ref 10–35)
Albumin: 4 g/dL (ref 3.6–5.1)
Alkaline Phosphatase: 98 U/L (ref 33–130)
BILIRUBIN DIRECT: 0.1 mg/dL (ref <=0.2)
BILIRUBIN INDIRECT: 0.3 mg/dL (ref 0.2–1.2)
TOTAL PROTEIN: 6.5 g/dL (ref 6.1–8.1)
Total Bilirubin: 0.4 mg/dL (ref 0.2–1.2)

## 2015-11-04 LAB — TSH: TSH: 1.19 mIU/L

## 2015-11-04 LAB — LIPID PANEL
CHOLESTEROL: 147 mg/dL (ref 125–200)
HDL: 47 mg/dL (ref >=46)
LDL Cholesterol: 86 mg/dL (ref <130)
TRIGLYCERIDES: 69 mg/dL (ref <150)
Total CHOL/HDL Ratio: 3.1 Ratio (ref <=5.0)
VLDL: 14 mg/dL (ref <30)

## 2015-11-05 ENCOUNTER — Encounter: Payer: BLUE CROSS/BLUE SHIELD | Admitting: Internal Medicine

## 2015-11-08 ENCOUNTER — Encounter: Payer: Self-pay | Admitting: Internal Medicine

## 2015-11-08 ENCOUNTER — Ambulatory Visit (INDEPENDENT_AMBULATORY_CARE_PROVIDER_SITE_OTHER): Payer: Managed Care, Other (non HMO) | Admitting: Internal Medicine

## 2015-11-08 VITALS — BP 122/80 | HR 97 | Temp 99.0°F | Resp 18 | Ht 62.0 in | Wt 209.5 lb

## 2015-11-08 DIAGNOSIS — Z0000 Encounter for general adult medical examination without abnormal findings: Secondary | ICD-10-CM

## 2015-11-08 DIAGNOSIS — E669 Obesity, unspecified: Secondary | ICD-10-CM | POA: Diagnosis not present

## 2015-11-08 DIAGNOSIS — Z8542 Personal history of malignant neoplasm of other parts of uterus: Secondary | ICD-10-CM | POA: Diagnosis not present

## 2015-11-08 LAB — POCT URINALYSIS DIPSTICK
BILIRUBIN UA: NEGATIVE
Blood, UA: NEGATIVE
Glucose, UA: NEGATIVE
KETONES UA: NEGATIVE
LEUKOCYTES UA: NEGATIVE
Nitrite, UA: NEGATIVE
PH UA: 8
PROTEIN UA: NEGATIVE
SPEC GRAV UA: 1.025
Urobilinogen, UA: 0.2

## 2015-11-08 NOTE — Patient Instructions (Addendum)
Vitamin D level is pending. It was a pleasesure to see you today. Continue diet and exercise regimen. Return in one year. Consider colonoscopy cologuard.

## 2015-11-08 NOTE — Progress Notes (Signed)
   Subjective:    Patient ID: Audrey Yoder, female    DOB: 1950-11-09, 65 y.o.   MRN: [id number]  HPI 65 year old female in today for health maintenance exam and evaluation of medical issues. Patient was diagnosed with uterine cancer in late 2010. She underwent a modified radical hysterectomy with BSO and bilateral lymph node dissection January 2011 at [facility]. Path all G showed grade 2 histology. She had negative nodes and 86% depth of invasion of the uterine wall. Overall her surgery staging was 1-be. Patient had vaginal cuff brachii therapy for 3 doses one month apart his adjuvant therapy. Subsequently was released from follow-up.  Had Zostavax vaccine July 2012. Tetanus immunization July 2012.  Recently had mammogram at [facility].  She is allergic to Sulfa.  History of kidney stones  Patient had colonoscopy or sigmoidoscopy around 2003 at low bowel or but we have not been able to locate those records. Patient has not wanted to pursue colonoscopy.  History of left knee osteoarthritis 2008 with steroid injection by orthopedist. History of vitamin D deficiency in 2010. At that time her level was 11.  In 2014 she was treated for presumed tickborne illness and an urgent care center and [place]. She had flulike symptoms and reportedly had had a recent tick bite.  Social history: She has a JD degree and is married. She is self-employed. Does not smoke. Drinks wine infrequently. Has 2 adult sons. Husband is a diabetic and recently had a hypoglycemic reaction which frightened her.  Family history: Father with history of myeloproliferative disorder. Mother with history of stroke. One brother with history of hemochromatosis.    Review of Systems has had recent URI symptoms. CBC shows elevated monocytes.     Objective:   Physical Exam  Constitutional: She is oriented to person, place, and time. She appears well-developed and well-nourished. No distress.  HENT:  Head: Normocephalic  and atraumatic.  Right Ear: External ear normal.  Left Ear: External ear normal.  Mouth/Throat: Oropharynx is clear and moist. No oropharyngeal exudate.  Eyes: Conjunctivae and EOM are normal. Pupils are equal, round, and reactive to light. Right eye exhibits no discharge. Left eye exhibits no discharge. No scleral icterus.  Neck: Neck supple. No JVD present. No thyromegaly present.  Cardiovascular: Normal rate, regular rhythm, normal heart sounds and intact distal pulses.   No murmur heard. Pulmonary/Chest: Effort normal and breath sounds normal. No respiratory distress. She has no wheezes. She has no rales. She exhibits no tenderness.  Abdominal: She exhibits no distension and no mass. There is no tenderness. There is no rebound and no guarding.  Genitourinary:  Deferred status post hysterectomy BSO  Musculoskeletal: She exhibits no edema.  Lymphadenopathy:    She has no cervical adenopathy.  Neurological: She is alert and oriented to person, place, and time. She has normal reflexes. No cranial nerve deficit. Coordination normal.  Skin: Skin is warm and dry. No rash noted. She is not diaphoretic.  Psychiatric: She has a normal mood and affect. Her behavior is normal. Judgment and thought content normal.  Vitals reviewed.         Assessment & Plan:  History of uterine cancer status post total abdominal hysterectomy BSO. Has been followed at [facility] and released.  Obesity  History of vitamin D deficiency-vitamin D level is pending  History of kidney stones  Plan: Return in one year or as needed.

## 2015-11-09 LAB — VITAMIN D 25 HYDROXY (VIT D DEFICIENCY, FRACTURES): Vit D, 25-Hydroxy: 34 ng/mL (ref 30–100)

## 2016-03-10 ENCOUNTER — Ambulatory Visit (INDEPENDENT_AMBULATORY_CARE_PROVIDER_SITE_OTHER): Payer: Medicare Other | Admitting: Internal Medicine

## 2016-03-10 ENCOUNTER — Telehealth: Payer: Self-pay | Admitting: Internal Medicine

## 2016-03-10 ENCOUNTER — Encounter: Payer: Self-pay | Admitting: Internal Medicine

## 2016-03-10 VITALS — BP 130/80 | HR 83 | Temp 99.4°F | Wt 217.5 lb

## 2016-03-10 DIAGNOSIS — K625 Hemorrhage of anus and rectum: Secondary | ICD-10-CM | POA: Diagnosis not present

## 2016-03-10 LAB — CBC WITH DIFFERENTIAL/PLATELET
BASOS PCT: 0 %
Basophils Absolute: 0 cells/uL (ref 0–200)
EOS PCT: 2 %
Eosinophils Absolute: 140 cells/uL (ref 15–500)
HEMATOCRIT: 40.1 % (ref 35.0–45.0)
HEMOGLOBIN: 13.4 g/dL (ref 11.7–15.5)
LYMPHS ABS: 1470 {cells}/uL (ref 850–3900)
Lymphocytes Relative: 21 %
MCH: 30.2 pg (ref 27.0–33.0)
MCHC: 33.4 g/dL (ref 32.0–36.0)
MCV: 90.5 fL (ref 80.0–100.0)
MONO ABS: 560 {cells}/uL (ref 200–950)
MPV: 9.4 fL (ref 7.5–12.5)
Monocytes Relative: 8 %
NEUTROS ABS: 4830 {cells}/uL (ref 1500–7800)
NEUTROS PCT: 69 %
PLATELETS: 278 10*3/uL (ref 140–400)
RBC: 4.43 MIL/uL (ref 3.80–5.10)
RDW: 13.2 % (ref 11.0–15.0)
WBC: 7 10*3/uL (ref 3.8–10.8)

## 2016-03-10 NOTE — Progress Notes (Signed)
   Subjective:    Patient ID: Audrey Yoder, female    DOB: [birth year], 65 y.o.   MRN: [id number]  HPI Patient noted rectal bleeding with bowel movement on Monday, September 18. She had it the following day as well in the early morning with bowel movement. Had no rectal bleeding on September 20. Had rectal bleeding again yesterday as well as today. Does not feel symptomatic. Has had no syncope. Has continued to work. No abdominal pain.  Had colonoscopy 2003 by Dr. Olevia Perches showing diverticulosis.  Has not had issues previously with hemorrhoids or anal fissures.  History of uterine cancer in 2011. She underwent a modified radical hysterectomy with BSO and bilateral pelvic lymph node dissection January 2011 at [facility]. She had negative nodes. Staging was 1-B patient also had vaginal cuff brachii therapy 3 doses one month apart as adjuvant therapy she is allergic to sulfa. History of left knee osteoarthritis 2008 steroid injection.  She has a JD degree and is married. She is a self-employed Forensic psychologist. Does not smoke. Drinks wine infrequently. 2 adult sons.  Family history: Father age 87 with history of myeloproliferative disorder. Mother age 65 with history of stroke. One brother with history of hemachromatosis.  Patient is overweight and has history of vitamin D deficiency, however vitamin D level May 2017 was normal.      Review of Systems as above     Objective:   Physical Exam Abdomen is obese soft nondistended without hepatosplenomegaly masses or significant tenderness. Rectal exam: No masses with digital rectal exam. Had maroon-colored stool on glove which was strongly guaiac positive. Anoscopy showed more maroon colored stool above anoscope. No fissures. One enlarged hemorrhoid at 1:00 externally.       Assessment & Plan:  Possible diverticular bleed  Plan: Patient appears to be stable. CBC with differential drawn today. Patient is to stay on clear liquid diet.  Appointment to see Dr. Carlean Purl on Monday, September 25 at 8:30 AM. Call if symptoms worsen.

## 2016-03-10 NOTE — Patient Instructions (Signed)
To see Dr. Carlean Purl on Monday, September 25 at 8:30 AM. Maintain clear liquid diet cola symptoms worsen. CBC with differential pending.

## 2016-03-11 ENCOUNTER — Encounter: Payer: Self-pay | Admitting: Internal Medicine

## 2016-03-11 NOTE — Telephone Encounter (Signed)
Hemoglobin stable at 13.4 g. A  decrease of 1.3 g from 4 months ago. Patient informed by phone.

## 2016-03-13 ENCOUNTER — Ambulatory Visit (INDEPENDENT_AMBULATORY_CARE_PROVIDER_SITE_OTHER): Payer: Managed Care, Other (non HMO) | Admitting: Internal Medicine

## 2016-03-13 ENCOUNTER — Encounter: Payer: Self-pay | Admitting: Internal Medicine

## 2016-03-13 VITALS — BP 124/74 | HR 80 | Ht 62.0 in | Wt 217.0 lb

## 2016-03-13 DIAGNOSIS — K921 Melena: Secondary | ICD-10-CM | POA: Diagnosis not present

## 2016-03-13 DIAGNOSIS — R1313 Dysphagia, pharyngeal phase: Secondary | ICD-10-CM

## 2016-03-13 NOTE — Progress Notes (Signed)
Audrey Yoder 65 y.o. [birth year] [id number]  Assessment & Plan:   Encounter Diagnoses  Name Primary?  . Hematochezia Yes  . Pharyngeal dysphagia    Bleeding appears to have stopped her to be stopping. Hgb NL 3 d ago is reassuring.  Diff Dx: diverticulosis, AVM's, neoplasia , hemorrhoids. It is possible that the treatment for her endometrial carcinoma which involved some radiation could've caused rectal or sigmoid colon angiodysplasia I have seen that in the past though it is rare. I have explained to her that if she does have that I will not be able to treat that in our endoscopy center. She would need another colonoscopy or sigmoidoscopy at the hospital with argon plasma coagulation. She is aware of this possibility.   The pharyngeal dysphagia symptoms seem innocent overall and a been chronic. Plan: Colonoscopy The risks and benefits as well as alternatives of endoscopic procedure(s) have been discussed and reviewed. All questions answered. The patient agrees to proceed.  Call back/ED if recurrence of significant bleeding  She prefers to observe the pharyngeal dysphagia symptoms at this time.  I appreciate the opportunity to care for this patient. 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 Approximately one week ago she had 2-3 bloody stools over at 24-48 hour period mostly just passing blood without any pain maroon in color. She had a period of time without this and then about 24 hours later had another small passage of blood and says today at this point her bowel movements are brown with just a slight bit of blood with wiping. She saw Dr. Renold Genta 3 days ago and hemoglobin was normal rectal exam showed maroon stool. Anoscopy showed that with  possible external hemorrhoid. Again since that time no significant bleeding reported. She is not had presyncope  or chest pain or dizziness. She's never had problems like this before. Her last colonoscopy was in 2003 by Dr. Olevia Perches and demonstrated diverticulosis. At that time she had had rectal bleeding as well. Also occ chokes with eating. - "food goes wrong way". She says this is a rare problem that has been bothering her off and on for many years but she is not too concerned about this. There are no signs or symptoms of esophageal dysphagia disclosed during the interview. No upper GIsxs o/w  She has avoided a repeat [place] colonoscopy because she does not like the prep process. She tells me several times during the isn't that she is a "difficult patient".   Medications, allergies, past medical history, past surgical history, family history and social history are reviewed and updated in the EMR.  Review of Systems As per history of present illness. All other review of systems appear negative at this time.  Objective:   Physical Exam @BP  124/74 (BP Location: Left Arm, Patient Position: Sitting, Cuff Size: Large)   Pulse 80   Ht 5\' 2"  (1.575 m)   Wt 217 lb (98.4 kg)   SpO2 97%   BMI 39.69 kg/m @  General:  Well-developed, well-nourished and in no acute distress Eyes:  anicteric. ENT:   Mouth and posterior pharynx free of lesions.  Neck:   supple w/o thyromegaly or mass.  Lungs: Clear to auscultation bilaterally. Heart:  S1S2, no rubs, murmurs, gallops. Abdomen:  soft, non-tender, no hepatosplenomegaly, hernia, or mass and BS+.  Rectal: Deferred until colonoscopy Lymph:  no cervical or  supraclavicular adenopathy. Extremities:   no edema, cyanosis or clubbing Skin   no rash. Neuro:  A&O x 3.  Psych:  appropriate mood and  Affect.   Data Reviewed: Primary care notes labs from 03/10/2016, colonoscopy report and photos from 2003

## 2016-03-13 NOTE — Patient Instructions (Signed)
  You have been scheduled for a colonoscopy. Please follow written instructions given to you at your visit today.  Please pick up your prep supplies at the pharmacy. If you use inhalers (even only as needed), please bring them with you on the day of your procedure.   If you have more bleeding call us or go to the nearest ED.      I appreciate the opportunity to care for you. Silvano Rusk, MD, [facility]

## 2016-03-14 ENCOUNTER — Encounter: Payer: Self-pay | Admitting: Internal Medicine

## 2016-03-17 ENCOUNTER — Encounter: Payer: Self-pay | Admitting: Internal Medicine

## 2016-03-21 ENCOUNTER — Telehealth: Payer: Self-pay | Admitting: Internal Medicine

## 2016-03-21 NOTE — Telephone Encounter (Signed)
Left message for patient to call back  

## 2016-03-21 NOTE — Telephone Encounter (Signed)
Patient advised that she should proceed with colonoscopy as planned.  She read on the internet that the bleeding could be from diverticulitis.  She denies any pain at this point.  I have tried to reassure her that everything will be fine to proceed with her scheduled colonoscopy.

## 2016-03-23 ENCOUNTER — Encounter: Payer: Self-pay | Admitting: Internal Medicine

## 2016-03-23 ENCOUNTER — Ambulatory Visit (AMBULATORY_SURGERY_CENTER): Payer: Managed Care, Other (non HMO) | Admitting: Internal Medicine

## 2016-03-23 VITALS — BP 117/65 | HR 79 | Temp 99.3°F | Resp 20 | Ht 62.0 in | Wt 217.0 lb

## 2016-03-23 DIAGNOSIS — D120 Benign neoplasm of cecum: Secondary | ICD-10-CM | POA: Diagnosis not present

## 2016-03-23 DIAGNOSIS — K921 Melena: Secondary | ICD-10-CM | POA: Diagnosis present

## 2016-03-23 MED ORDER — SODIUM CHLORIDE 0.9 % IV SOLN: 500.0000 mL | INTRAVENOUS | Status: DC

## 2016-03-23 NOTE — Progress Notes (Signed)
IV started in right wrist by Probation officer.  IV went in smoothly without any difficulty.  Good blood return noted on insertion.  No bruising or redness noted.  Patient complains of pain with IV.  Removed tape and repositioned IV without any relief. Offered to restart IV but patient states she does "not want to be stuck again".  Will recheck with patient to see if there is any improvement.

## 2016-03-23 NOTE — Progress Notes (Signed)
Report to PACU, RN, vss, BBS= Clear.  

## 2016-03-23 NOTE — Patient Instructions (Addendum)
I found and removed one polyp. I will let you know pathology results and when to have another routine colonoscopy by mail.  There was diverticulosis in the colon and I suspect that you have had bleeding from this - blood vessels inside the diverticula will bleed at times.  I did not see any other possible cause of bleeding.  I appreciate the opportunity to care for you. Gatha Mayer, MD, FACG   YOU HAD AN ENDOSCOPIC PROCEDURE TODAY AT [facility]:   Refer to the procedure report that was given to you for any specific questions about what was found during the examination.  If the procedure report does not answer your questions, please call your gastroenterologist to clarify.  If you requested that your care partner not be given the details of your procedure findings, then the procedure report has been included in a sealed envelope for you to review at your convenience later.  YOU SHOULD EXPECT: Some feelings of bloating in the abdomen. Passage of more gas than usual.  Walking can help get rid of the air that was put into your GI tract during the procedure and reduce the bloating. If you had a lower endoscopy (such as a colonoscopy or flexible sigmoidoscopy) you may notice spotting of blood in your stool or on the toilet paper. If you underwent a bowel prep for your procedure, you may not have a normal bowel movement for a few days.  Please Note:  You might notice some irritation and congestion in your nose or some drainage.  This is from the oxygen used during your procedure.  There is no need for concern and it should clear up in a day or so.  SYMPTOMS TO REPORT IMMEDIATELY:   Following lower endoscopy (colonoscopy or flexible sigmoidoscopy):  Excessive amounts of blood in the stool  Significant tenderness or worsening of abdominal pains  Swelling of the abdomen that is new, acute  Fever of 100F or higher  For urgent or emergent issues, a gastroenterologist can  be reached at any hour by calling [phone].  DIET:  We do recommend a small meal at first, but then you may proceed to your regular diet.  Drink plenty of fluids but you should avoid alcoholic beverages for 24 hours.  ACTIVITY:  You should plan to take it easy for the rest of today and you should NOT DRIVE or use heavy machinery until tomorrow (because of the sedation medicines used during the test).    FOLLOW UP: Our staff will call the number listed on your records the next business day following your procedure to check on you and address any questions or concerns that you may have regarding the information given to you following your procedure. If we do not reach you, we will leave a message.  However, if you are feeling well and you are not experiencing any problems, there is no need to return our call.  We will assume that you have returned to your regular daily activities without incident.  If any biopsies were taken you will be contacted by phone or by letter within the next 1-3 weeks.  Please call us at [phone] if you have not heard about the biopsies in 3 weeks.   SIGNATURES/CONFIDENTIALITY: You and/or your care partner have signed paperwork which will be entered into your electronic medical record.  These signatures attest to the fact that that the information above on your After Visit Summary has been reviewed and  is understood.  Full responsibility of the confidentiality of this discharge information lies with you and/or your care-partner.  Await pathology to determine next colonoscopy  Please read over handout about polyps and diverticulosis  Please continue your normal medications

## 2016-03-23 NOTE — Progress Notes (Signed)
Rechecked pts IV.  She states that it is some better but still complains of discomfort.  Advised her that I would like to take it out and restart IV.  Pt states that she does not want to be stuck again.   In addition, she received a reminder phone call yesterday and was not happy that a message was left at the number called.  A DPR was printed and given to patient for her to fill out regarding messages left.

## 2016-03-23 NOTE — Progress Notes (Signed)
IV removed without difficulty, tip intact.  No redness, bruising or swelling noted at IV site.

## 2016-03-23 NOTE — Op Note (Signed)
[place] Patient Name: Audrey Yoder Procedure Date: 03/23/2016 3:27 PM MRN: [id number] Endoscopist: Gatha Mayer , MD Age: 65 Referring MD:  Date of Birth: 21-Oct-1950 Gender: Female Account #: [id number] Procedure:                Colonoscopy Indications:              Evaluation of unexplained GI bleeding, Hematochezia Medicines:                Propofol per Anesthesia, Monitored Anesthesia Care Procedure:                Pre-Anesthesia Assessment:                           - Prior to the procedure, a History and Physical                            was performed, and patient medications and                            allergies were reviewed. The patient's tolerance of                            previous anesthesia was also reviewed. The risks                            and benefits of the procedure and the sedation                            options and risks were discussed with the patient.                            All questions were answered, and informed consent                            was obtained. Prior Anticoagulants: The patient has                            taken no previous anticoagulant or antiplatelet                            agents. ASA Grade Assessment: II - A patient with                            mild systemic disease. After reviewing the risks                            and benefits, the patient was deemed in                            satisfactory condition to undergo the procedure.                           After obtaining informed consent, the colonoscope  was passed under direct vision. Throughout the                            procedure, the patient's blood pressure, pulse, and                            oxygen saturations were monitored continuously. The                            Model CF-HQ190L [id number]) scope was introduced                            through the anus and advanced to the the cecum,                   identified by appendiceal orifice and ileocecal                            valve. The colonoscopy was performed without                            difficulty. The patient tolerated the procedure                            well. The quality of the bowel preparation was                            good. The bowel preparation used was Miralax. The                            ileocecal valve, appendiceal orifice, and rectum                            were photographed. Scope In: 3:31:33 PM Scope Out: 3:51:17 PM Scope Withdrawal Time: 0 hours 15 minutes 26 seconds  Total Procedure Duration: 0 hours 19 minutes 44 seconds  Findings:                 The perianal and digital rectal examinations were                            normal.                           A 10 mm polyp was found in the cecum. The polyp was                            flat. The polyp was removed with a piecemeal                            technique using a cold snare. Resection and                            retrieval were complete. Verification of patient  identification for the specimen was done. Estimated                            blood loss was minimal.                           Multiple small and large-mouthed diverticula were                            found in the left colon.                           The exam was otherwise without abnormality on                            direct and retroflexion views. Complications:            No immediate complications. Estimated Blood Loss:     Estimated blood loss was minimal. Impression:               - One 10 mm polyp in the cecum, removed piecemeal                            using a cold snare. Resected and retrieved.                           - Severe diverticulosis in the left colon.                           - The examination was otherwise normal on direct                            and retroflexion views. Recommendation:           -  Patient has a contact number available for                            emergencies. The signs and symptoms of potential                            delayed complications were discussed with the                            patient. Return to normal activities tomorrow.                            Written discharge instructions were provided to the                            patient.                           - Resume previous diet.                           - Continue present medications.                           -  Repeat colonoscopy is recommended. The                            colonoscopy date will be determined after pathology                            results from today's exam become available for                            review.                           -                           SUSPECT BLEEDING WAS DIVERTICULAR IN ORIGIN Gatha Mayer, MD 03/23/2016 3:57:16 PM This report has been signed electronically.

## 2016-03-23 NOTE — Progress Notes (Signed)
Called to room to assist during endoscopic procedure.  Patient ID and intended procedure confirmed with present staff. Received instructions for my participation in the procedure from the performing physician.  

## 2016-03-24 ENCOUNTER — Telehealth: Payer: Self-pay

## 2016-03-24 NOTE — Telephone Encounter (Signed)
  Follow up Call-  Call back number 03/23/2016  Post procedure Call Back phone  # [phone]- please do not leave message  Permission to leave phone message No  Some recent data might be hidden     Patient questions:  Do you have a fever, pain , or abdominal swelling? No. Pain Score  0 *  Have you tolerated food without any problems? Yes.    Have you been able to return to your normal activities? Yes.    Do you have any questions about your discharge instructions: Diet   No. Medications  No. Follow up visit  No.  Do you have questions or concerns about your Care? No.  Actions: * If pain score is 4 or above: No action needed, pain <4.

## 2016-03-31 ENCOUNTER — Encounter: Payer: Self-pay | Admitting: Internal Medicine

## 2016-03-31 DIAGNOSIS — Z860100 Personal history of colon polyps, unspecified: Secondary | ICD-10-CM | POA: Insufficient documentation

## 2016-03-31 HISTORY — DX: Personal history of colon polyps, unspecified: Z86.0100

## 2016-03-31 HISTORY — DX: Personal history of colonic polyps: Z86.010

## 2016-03-31 NOTE — Progress Notes (Signed)
10 mm ssp Recall 2020

## 2016-06-29 IMAGING — CT CT ABD-PELV W/O CM
3 of 4 series · 10 of 46 positions shown, 17 images · non-contrast
Comparison: None.

CLINICAL DATA: Right flank pain, hematuria a [DATE], status post
hysterectomy, passed 2 stone in [DATE]

EXAM:
CT ABDOMEN AND PELVIS WITHOUT CONTRAST
TECHNIQUE: Multidetector CT imaging of the abdomen and pelvis was performed
following the standard protocol without IV contrast.

[Series 3: lung windows · axial · 0.59mm/px · z∈[-152,-47]mm · 6 of 31 slices shown, 11 images]
[im 5/31  soft-tissue]
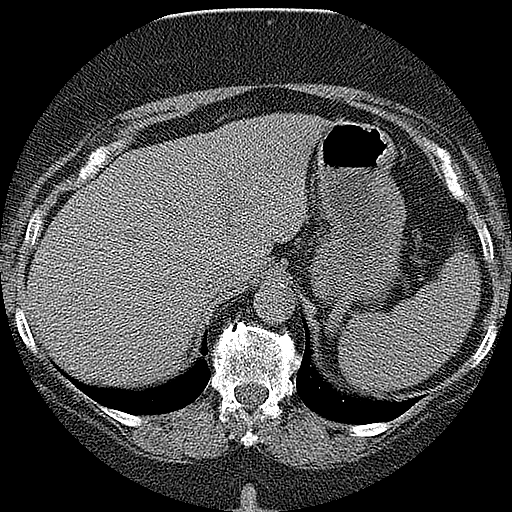
[im 5/31  bone]
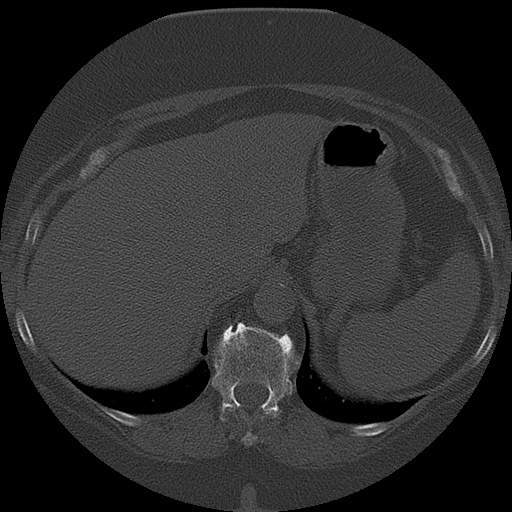
[im 9/31  soft-tissue]
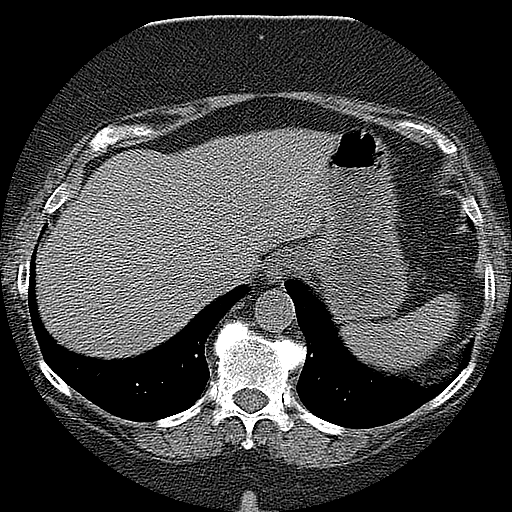
[im 13/31  soft-tissue]
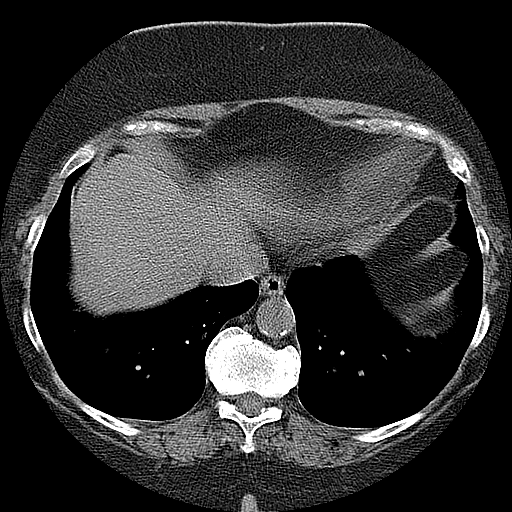
[im 13/31  lung]
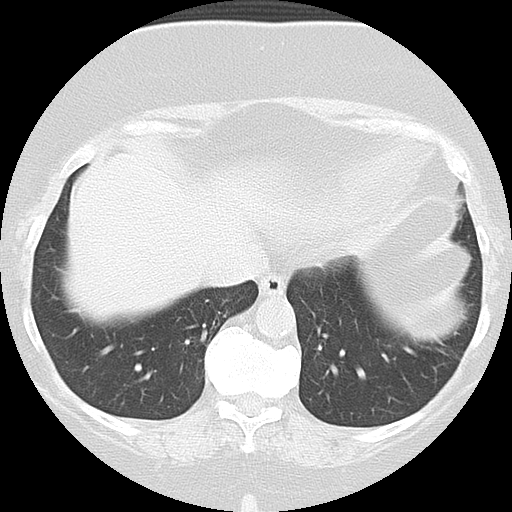
[im 18/31  soft-tissue]
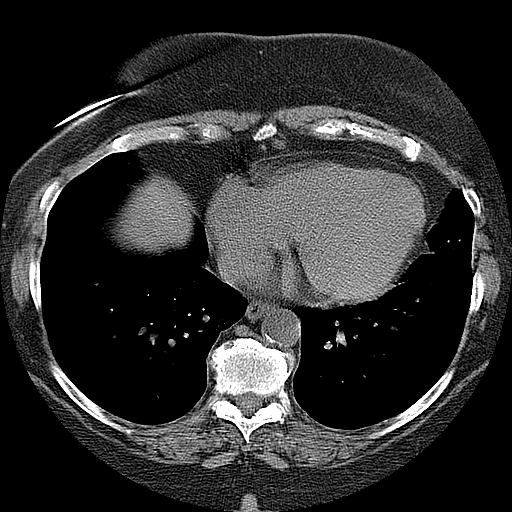
[im 18/31  lung]
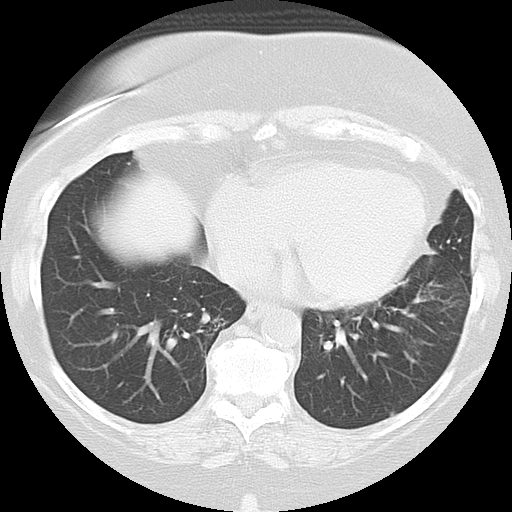
[im 22/31  soft-tissue]
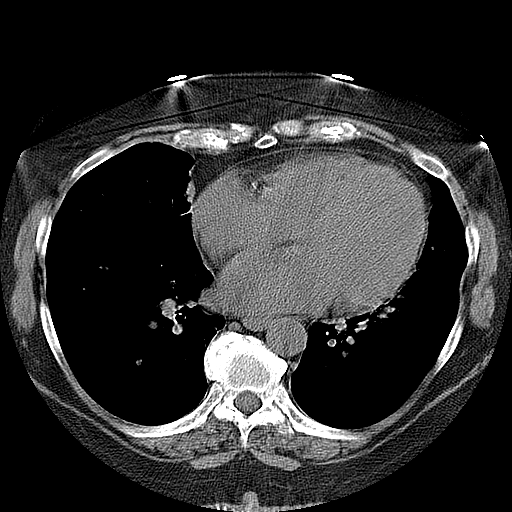
[im 22/31  lung]
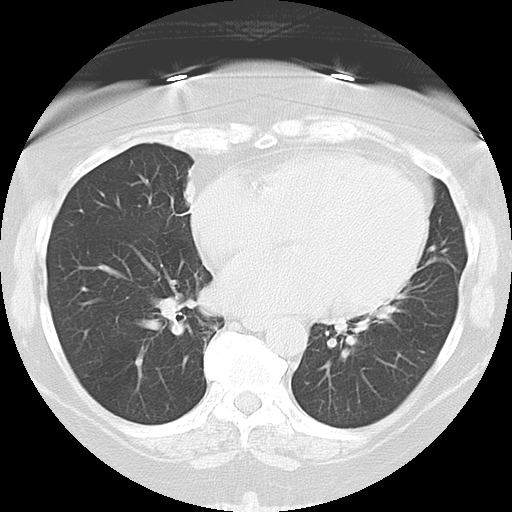
[im 26/31  soft-tissue]
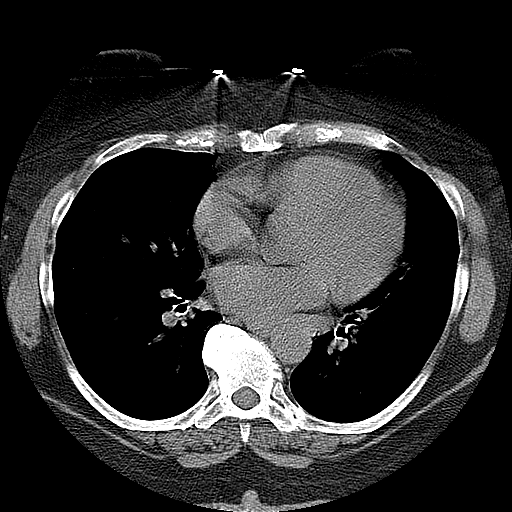
[im 26/31  lung]
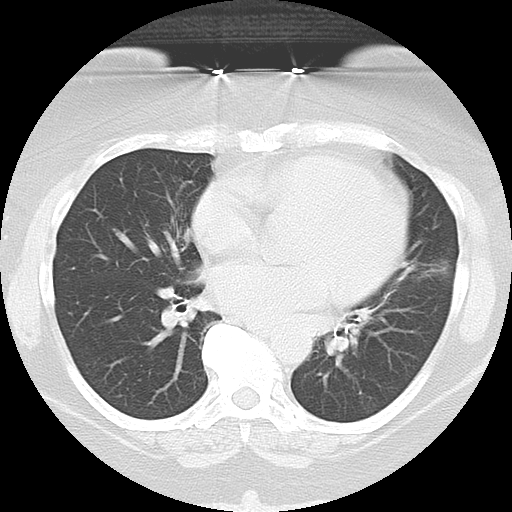

[Series 401: cor · coronal · 1.01mm/px · 3 of 139 slices shown, 4 images]
[im 47/139  soft-tissue]
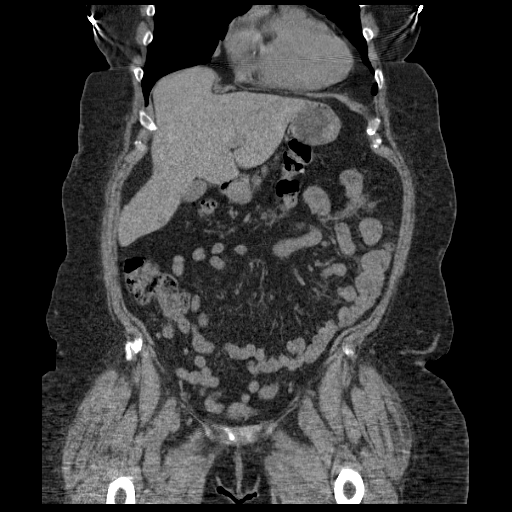
[im 62/139  soft-tissue]
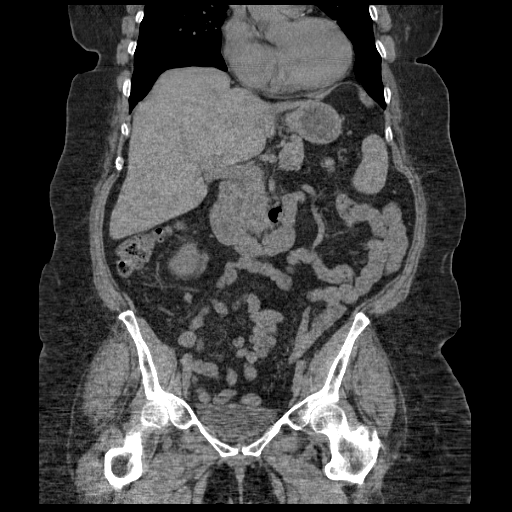
[im 62/139  bone]
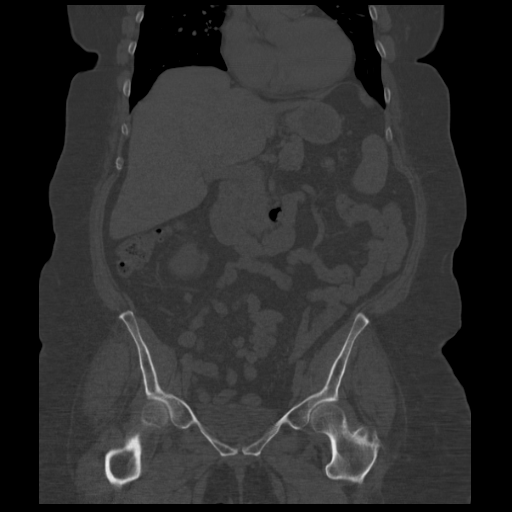
[im 77/139  soft-tissue]
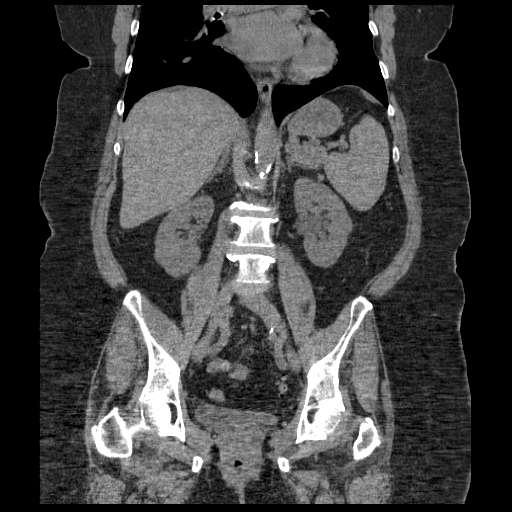

[Series 402: sag · sagittal · 1.01mm/px · 1 of 189 slices shown, 2 images]
[im 63/189  soft-tissue]
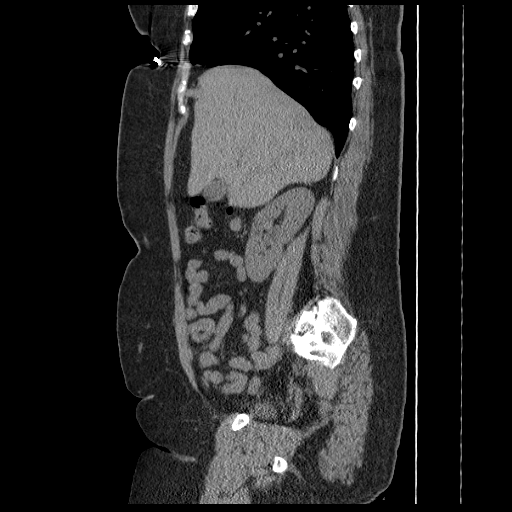
[im 63/189  bone]
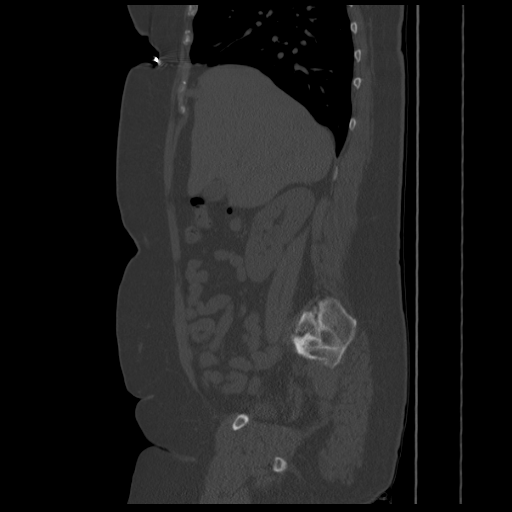

[10 of 46 positions shown; findings below may reference images not displayed]

FINDINGS: Sagittal images of the spine shows degenerative changes lower
thoracic and upper lumbar spine.

The lung bases are unremarkable.

Unenhanced liver shows no biliary ductal dilatation. Gallbladder is
contracted without evidence of calcified gallstones. Unenhanced
pancreas, spleen and adrenal glands are unremarkable. Unenhanced
kidneys are symmetrical in size. No hydronephrosis or hydroureter.
No calcified renal calculi are noted bilaterally. No calcified
ureteral calculi are noted bilaterally. Pelvic phleboliths are
noted.

Atherosclerotic calcifications of abdominal aorta and iliac
arteries. No small bowel obstruction. No ascites or free air. No
adenopathy. No pericecal inflammation. Normal appendix clearly
visualized in axial image 71.

Multiple sigmoid colon diverticula are noted. The patient is status
post hysterectomy. Few diverticula are noted descending colon. No
evidence of acute diverticulitis. No calcified calculi are noted
within urinary bladder. No inguinal adenopathy. No destructive bony
lesions are noted within pelvis. Mild degenerative changes bilateral
SI joints.
IMPRESSION: 1. No nephrolithiasis.  No hydronephrosis or hydroureter.
2. No calcified ureteral calculi.
3. No pericecal inflammation.  Normal appendix.
4. Status post hysterectomy.
5. Few diverticula are noted descending colon. Multiple sigmoid
colon diverticula. No evidence acute diverticulitis.
6. Degenerative changes thoracolumbar spine and bilateral SI joints.

## 2016-07-13 ENCOUNTER — Encounter: Payer: Self-pay | Admitting: Internal Medicine

## 2016-07-13 ENCOUNTER — Ambulatory Visit (INDEPENDENT_AMBULATORY_CARE_PROVIDER_SITE_OTHER): Payer: Managed Care, Other (non HMO) | Admitting: Internal Medicine

## 2016-07-13 VITALS — BP 112/80 | HR 75 | Temp 99.0°F | Wt 209.0 lb

## 2016-07-13 DIAGNOSIS — J0100 Acute maxillary sinusitis, unspecified: Secondary | ICD-10-CM | POA: Diagnosis not present

## 2016-07-13 DIAGNOSIS — R509 Fever, unspecified: Secondary | ICD-10-CM

## 2016-07-13 DIAGNOSIS — J111 Influenza due to unidentified influenza virus with other respiratory manifestations: Secondary | ICD-10-CM | POA: Diagnosis not present

## 2016-07-13 DIAGNOSIS — R05 Cough: Secondary | ICD-10-CM

## 2016-07-13 DIAGNOSIS — R059 Cough, unspecified: Secondary | ICD-10-CM

## 2016-07-13 DIAGNOSIS — J209 Acute bronchitis, unspecified: Secondary | ICD-10-CM | POA: Diagnosis not present

## 2016-07-13 MED ORDER — BENZONATATE 100 MG PO CAPS: 100.0000 mg | ORAL_CAPSULE | Freq: Three times a day (TID) | ORAL | 0 refills | Status: DC | PRN

## 2016-07-13 MED ORDER — HYDROCODONE-HOMATROPINE 5-1.5 MG/5ML PO SYRP: 5.0000 mL | ORAL_SOLUTION | Freq: Three times a day (TID) | ORAL | 0 refills | Status: DC | PRN

## 2016-07-13 MED ORDER — AZITHROMYCIN 250 MG PO TABS: ORAL_TABLET | ORAL | 0 refills | Status: DC

## 2016-07-13 NOTE — Progress Notes (Signed)
   Subjective:    Patient ID: Audrey Yoder, female    DOB: Apr 08, 1951, 66 y.o.   MRN: [id number]  HPI   66 year old Female onset Wednesday, January 17 of fever chills myalgias consistent with influenza symptoms. Has now developed cough and congestion.    Review of Systems has frontal and maxillary sinus pressure     Objective:   Physical Exam Skin warm and dry. Nodes none. Pharynx slightly injected. Neck is supple. Chest clear to auscultation. She sounds nasally congested.       Assessment & Plan:  Status post influenza  Sinusitis  Acute bronchitis  Plan: Hycodan 1 teaspoon by mouth every 6-8 hours when necessary cough. Tessalon Perles 100 mg 3 times daily to take during the day which will not make her drowsy. Zithromax Z-PAK take 2 tablets day 1 followed by 1 tablet days 2 through 5. Rest and drink plenty of fluids.

## 2016-07-13 NOTE — Patient Instructions (Signed)
Take Zithromax Z-PAK as directed. Hycodan as needed for cough at night and Tessalon Perles as needed for cough during the day. Rest and drink plenty of fluids.

## 2016-11-14 ENCOUNTER — Other Ambulatory Visit: Payer: Managed Care, Other (non HMO) | Admitting: Internal Medicine

## 2016-11-14 DIAGNOSIS — Z8639 Personal history of other endocrine, nutritional and metabolic disease: Secondary | ICD-10-CM

## 2016-11-14 DIAGNOSIS — Z130 Encounter for screening for diseases of the blood and blood-forming organs and certain disorders involving the immune mechanism: Secondary | ICD-10-CM

## 2016-11-14 DIAGNOSIS — E669 Obesity, unspecified: Secondary | ICD-10-CM

## 2016-11-14 DIAGNOSIS — Z0000 Encounter for general adult medical examination without abnormal findings: Secondary | ICD-10-CM

## 2016-11-14 DIAGNOSIS — Z1329 Encounter for screening for other suspected endocrine disorder: Secondary | ICD-10-CM

## 2016-11-14 DIAGNOSIS — E7800 Pure hypercholesterolemia, unspecified: Secondary | ICD-10-CM

## 2016-11-14 LAB — COMPLETE METABOLIC PANEL WITH GFR
ALT: 17 U/L (ref 6–29)
AST: 15 U/L (ref 10–35)
Albumin: 3.9 g/dL (ref 3.6–5.1)
Alkaline Phosphatase: 92 U/L (ref 33–130)
BUN: 17 mg/dL (ref 7–25)
CHLORIDE: 105 mmol/L (ref 98–110)
CO2: 24 mmol/L (ref 20–31)
Calcium: 9.1 mg/dL (ref 8.6–10.4)
Creat: 0.75 mg/dL (ref 0.50–0.99)
GFR, EST NON AFRICAN AMERICAN: 83 mL/min (ref >=60)
GFR, Est African American: >89 mL/min (ref >=60)
GLUCOSE: 81 mg/dL (ref 65–99)
POTASSIUM: 4.4 mmol/L (ref 3.5–5.3)
SODIUM: 141 mmol/L (ref 135–146)
Total Bilirubin: 0.5 mg/dL (ref 0.2–1.2)
Total Protein: 6.5 g/dL (ref 6.1–8.1)

## 2016-11-14 LAB — LIPID PANEL
Cholesterol: 165 mg/dL (ref <200)
HDL: 50 mg/dL — ABNORMAL LOW (ref >50)
LDL CALC: 97 mg/dL (ref <100)
Total CHOL/HDL Ratio: 3.3 Ratio (ref <5.0)
Triglycerides: 92 mg/dL (ref <150)
VLDL: 18 mg/dL (ref <30)

## 2016-11-14 LAB — CBC
HCT: 43.9 % (ref 35.0–45.0)
Hemoglobin: 14.8 g/dL (ref 11.7–15.5)
MCH: 30.5 pg (ref 27.0–33.0)
MCHC: 33.7 g/dL (ref 32.0–36.0)
MCV: 90.5 fL (ref 80.0–100.0)
MPV: 9.3 fL (ref 7.5–12.5)
PLATELETS: 259 10*3/uL (ref 140–400)
RBC: 4.85 MIL/uL (ref 3.80–5.10)
RDW: 13.5 % (ref 11.0–15.0)
WBC: 5.2 10*3/uL (ref 3.8–10.8)

## 2016-11-14 LAB — TSH: TSH: 1.13 m[IU]/L

## 2016-11-15 LAB — VITAMIN D 25 HYDROXY (VIT D DEFICIENCY, FRACTURES): VIT D 25 HYDROXY: 34 ng/mL (ref 30–100)

## 2016-11-17 ENCOUNTER — Ambulatory Visit (INDEPENDENT_AMBULATORY_CARE_PROVIDER_SITE_OTHER): Payer: Managed Care, Other (non HMO) | Admitting: Internal Medicine

## 2016-11-17 ENCOUNTER — Encounter: Payer: Self-pay | Admitting: Internal Medicine

## 2016-11-17 VITALS — BP 110/70 | HR 86 | Temp 99.0°F | Ht 62.0 in | Wt 210.0 lb

## 2016-11-17 DIAGNOSIS — R319 Hematuria, unspecified: Secondary | ICD-10-CM | POA: Diagnosis not present

## 2016-11-17 DIAGNOSIS — Z0000 Encounter for general adult medical examination without abnormal findings: Secondary | ICD-10-CM | POA: Diagnosis not present

## 2016-11-17 DIAGNOSIS — Z6838 Body mass index (BMI) 38.0-38.9, adult: Secondary | ICD-10-CM | POA: Diagnosis not present

## 2016-11-17 DIAGNOSIS — Z87442 Personal history of urinary calculi: Secondary | ICD-10-CM | POA: Diagnosis not present

## 2016-11-17 DIAGNOSIS — Z8542 Personal history of malignant neoplasm of other parts of uterus: Secondary | ICD-10-CM

## 2016-11-17 DIAGNOSIS — S63502A Unspecified sprain of left wrist, initial encounter: Secondary | ICD-10-CM | POA: Diagnosis not present

## 2016-11-17 LAB — POCT URINALYSIS DIPSTICK
Bilirubin, UA: NEGATIVE
Glucose, UA: NEGATIVE
Ketones, UA: NEGATIVE
Leukocytes, UA: NEGATIVE
Nitrite, UA: NEGATIVE
Protein, UA: NEGATIVE
Spec Grav, UA: 1.015 (ref 1.010–1.025)
Urobilinogen, UA: 0.2 E.U./dL
pH, UA: 6 (ref 5.0–8.0)

## 2016-11-17 NOTE — Progress Notes (Signed)
Subjective:    Patient ID: Audrey Yoder, female    DOB: 04-08-1951, 66 y.o.   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  Recently sprained her left wrist playing with grandson on a swing set. She says it is getting better. Is wearing an over-the-counter small soft wrist splint.  We determine she got Prevnar 13 in May 2016 but it was not noted in her chart until today.  She had Zostavax vaccine July 2012 in tetanus immunization July 2012. Needs Prevnar 23 but this was not given today.  She has a history of kidney stones and sometimes has microscopic hematuria.  No recent problems with kidney stones.  Mammogram done at [facility]. Graft she was diagnosed with uterine cancer in late 2010 and underwent a modified radical hysterectomy with BSO and bilateral lymph node dissection January 2011 at [facility]. Past showed grade 2 histology. She had negative nodes and 86% depth of invasion of the uterine wall. Overall her surgery staging was 1-B. She had vaginal cuff brachi therapy for 3 doses one month apart as adjuvant therapy. Subsequently has been released from follow-up  History of left knee osteoarthritis 2008 steroid injection by orthopedist. History of vitamin D deficiency. In 20 tender level was 11.  In 2014 she was treated for presumed tickborne illness and an urgent care center in [place]. She had flulike symptoms and reportedly had had a recent tick bite.  Social history: She has a JD degree and is married. She is self-employed. Does not smoke. Drinks wine infrequently. Has 2 adult sons. Husband is a diabetic and sometimes has hypoglycemic reactions.  Family history: Father with history of myeloproliferative disorder. Mother with history of stroke. One brother with history of hemachromatosis.      Review of Systems  Constitutional: Negative.   All other systems reviewed and are negative.        Objective:   Physical Exam  Constitutional: She is oriented to person, place, and time. She appears well-developed and well-nourished. No distress.  HENT:  Head: Normocephalic and atraumatic.  Right Ear: External ear normal.  Left Ear: External ear normal.  Mouth/Throat: Oropharynx is clear and moist.  Eyes: Conjunctivae are normal. Pupils are equal, round, and reactive to light. Right eye exhibits no discharge. Left eye exhibits no discharge. No scleral icterus.  Neck: Neck supple. No JVD present. No thyromegaly present.  Cardiovascular: Normal rate, regular rhythm, normal heart sounds and intact distal pulses.   No murmur heard. Pulmonary/Chest: Effort normal and breath sounds normal. No respiratory distress. She has no wheezes.  Breasts normal female without masses  Abdominal: Soft. Bowel sounds are normal. She exhibits no distension and no mass. There is no tenderness. There is no rebound and no guarding.  Genitourinary:  Genitourinary Comments: Deferred status post TAH/BSO  Musculoskeletal: Normal range of motion. She exhibits no edema.  Lymphadenopathy:    She has no cervical adenopathy.  Neurological: She is oriented to person, place, and time. She has normal reflexes. No cranial nerve deficit.  Skin: No rash noted. She is not diaphoretic.  Psychiatric: She has a normal mood and affect. Her behavior is normal. Judgment and thought content normal.  Vitals reviewed.         Assessment & Plan:  Sprained left wrist-mild and should resolve  Obesity-discussed diet exercise  History of uterine cancer status post surgery and treatment 2012 and disease free  Family  history of hemachromatosis and brother  Plan: Recommend diet exercise and weight loss and return in one year or as needed.

## 2016-11-18 LAB — URINALYSIS, MICROSCOPIC ONLY
BACTERIA UA: NONE SEEN [HPF]
Casts: NONE SEEN [LPF]
Yeast: NONE SEEN [HPF]

## 2016-11-18 LAB — URINE CULTURE

## 2016-11-19 NOTE — Patient Instructions (Signed)
It was a pleasure to see you today. Continue diet exercise and weight loss efforts and return in one year or as needed.

## 2016-12-25 ENCOUNTER — Encounter: Payer: Self-pay | Admitting: Internal Medicine

## 2017-01-19 ENCOUNTER — Ambulatory Visit (INDEPENDENT_AMBULATORY_CARE_PROVIDER_SITE_OTHER): Payer: Managed Care, Other (non HMO) | Admitting: Sports Medicine

## 2017-01-19 ENCOUNTER — Ambulatory Visit (INDEPENDENT_AMBULATORY_CARE_PROVIDER_SITE_OTHER): Payer: Managed Care, Other (non HMO)

## 2017-01-19 ENCOUNTER — Ambulatory Visit: Payer: Managed Care, Other (non HMO) | Admitting: Podiatry

## 2017-01-19 ENCOUNTER — Encounter: Payer: Self-pay | Admitting: Sports Medicine

## 2017-01-19 DIAGNOSIS — M79672 Pain in left foot: Secondary | ICD-10-CM

## 2017-01-19 DIAGNOSIS — M722 Plantar fascial fibromatosis: Secondary | ICD-10-CM | POA: Diagnosis not present

## 2017-01-19 MED ORDER — MELOXICAM 15 MG PO TABS: 15.0000 mg | ORAL_TABLET | Freq: Every day | ORAL | 0 refills | Status: DC

## 2017-01-19 MED ORDER — METHYLPREDNISOLONE 4 MG PO TBPK: ORAL_TABLET | ORAL | 0 refills | Status: DC

## 2017-01-19 NOTE — Progress Notes (Signed)
Subjective: Audrey Yoder is a 66 y.o. female patient presents to office with complaint of heel pain on the left. Patient admits to post static dyskinesia for since July 9 after treadmill increasing incline and then with another flare up on July 29 when she took a mis-step when going down steps that caused extreme pain where she had to use crutches. Patient admits a history of fasciitis many years ago. Denies any other pedal complaints.   Patient Active Problem List   Diagnosis Date Noted  . Hx of colonic polyps 03/31/2016  . Kidney stones 06/15/2014  . History of vitamin D deficiency 01/13/2012  . Elevated LDL cholesterol level 01/13/2012  . History of uterine cancer 01/13/2012  . Obesity 12/29/2010    Current Outpatient Prescriptions on File Prior to Visit  Medication Sig Dispense Refill  . multivitamin (THERAGRAN) per tablet Take 1 tablet by mouth daily.      Donita Brooks ointment      Current Facility-Administered Medications on File Prior to Visit  Medication Dose Route Frequency Provider Last Rate Last Dose  . 0.9 %  sodium chloride infusion  500 mL Intravenous Continuous Gatha Mayer, MD        Allergies  Allergen Reactions  . Sulfa Antibiotics Rash  . Levofloxacin     Joint pain    Objective: Physical Exam General: The patient is alert and oriented x3 in no acute distress.  Dermatology: Skin is warm, dry and supple bilateral lower extremities. Nails 1-10 are normal. There is no erythema, chronic fatty edema to legs and feet, no eccymosis, no open lesions present. Integument is otherwise unremarkable.  Vascular: Dorsalis Pedis pulse and Posterior Tibial pulse are 1/4 bilateral. Capillary fill time is immediate to all digits.  Neurological: Grossly intact to light touch with an achilles reflex of +2/5 and a  negative Tinel's sign bilateral.  Musculoskeletal: Moderate tenderness to palpation at the medial calcaneal tubercale and through the insertion of the plantar  fascia on the left foot. No pain with compression of calcaneus bilateral. No pain with tuning fork to calcaneus bilateral. No pain with calf compression bilateral. There is decreased Ankle joint range of motion bilateral. All other joints range of motion within normal limits bilateral. Strength 5/5 in all groups bilateral.   Gait: Unassisted, Antalgic avoid weight on left heel  Xray, Left foot:  Normal osseous mineralization. Joint spaces preserved except at midfoot and ankle where there is changes of arthritis. No fracture/dislocation/boney destruction. Calcaneal spur present with mild thickening of plantar fascia. No other soft tissue abnormalities or radiopaque foreign bodies.   Assessment and Plan: Problem List Items Addressed This Visit    None    Visit Diagnoses    Plantar fasciitis    -  Primary   Relevant Medications   methylPREDNISolone (MEDROL DOSEPAK) 4 MG TBPK tablet   meloxicam (MOBIC) 15 MG tablet   Other Relevant Orders   DG Foot Complete Left (Completed)   Inflammatory heel pain, left       Relevant Medications   methylPREDNISolone (MEDROL DOSEPAK) 4 MG TBPK tablet   meloxicam (MOBIC) 15 MG tablet     -Complete examination performed.  -Xrays reviewed -Discussed with patient in detail the condition of plantar fasciitis, how this occurs and general treatment options. Explained both conservative and surgical treatments.  -No injection at this time due to intense symptoms at left heel -Rx Meloxicam to start after Medrol dose pack is completed -Recommended CAM boot but patient declined thus  dispensed heel padding  -Will hold off on stretching exercises at this time -Recommend patient to ice affected area 1-2x daily. -Patient to return to office in 3 weeks for follow up or sooner if problems or questions arise. If no better will Rx MRI and suggest CAM boot again to r/o partial tear of plantar fascia vs stress fracture at heel.   Landis Martins, DPM

## 2017-01-19 NOTE — Progress Notes (Signed)
   Subjective:    Patient ID: Audrey Yoder, female    DOB: 08-25-1950, 66 y.o.   MRN: [id number]  HPI  Left heel pain started July 9th on July 29th pain was excruciating literally took me off my feet used crutches for the next 2 days .     Review of Systems  Constitutional: Positive for activity change.  Musculoskeletal: Positive for arthralgias and gait problem.       Objective:   Physical Exam        Assessment & Plan:

## 2017-02-09 ENCOUNTER — Ambulatory Visit: Payer: Managed Care, Other (non HMO) | Admitting: Sports Medicine

## 2017-02-14 ENCOUNTER — Encounter (INDEPENDENT_AMBULATORY_CARE_PROVIDER_SITE_OTHER): Payer: Self-pay

## 2017-02-14 ENCOUNTER — Ambulatory Visit (INDEPENDENT_AMBULATORY_CARE_PROVIDER_SITE_OTHER): Payer: Managed Care, Other (non HMO) | Admitting: Sports Medicine

## 2017-02-14 DIAGNOSIS — M79672 Pain in left foot: Secondary | ICD-10-CM

## 2017-02-14 DIAGNOSIS — M722 Plantar fascial fibromatosis: Secondary | ICD-10-CM

## 2017-02-14 NOTE — Progress Notes (Signed)
Subjective: Audrey Yoder is a 66 y.o. female patient return to office for follow-up of heel pain on the left. Patient states that pain is much better. No longer walks with a limp. Desires to discuss shoe options. Denies any other pedal complaints.   Patient Active Problem List   Diagnosis Date Noted  . Hx of colonic polyps 03/31/2016  . Kidney stones 06/15/2014  . History of vitamin D deficiency 01/13/2012  . Elevated LDL cholesterol level 01/13/2012  . History of uterine cancer 01/13/2012  . Obesity 12/29/2010    Current Outpatient Prescriptions on File Prior to Visit  Medication Sig Dispense Refill  . meloxicam (MOBIC) 15 MG tablet Take 1 tablet (15 mg total) by mouth daily. 30 tablet 0  . methylPREDNISolone (MEDROL DOSEPAK) 4 MG TBPK tablet Take 1st as instructed 21 tablet 0  . multivitamin (THERAGRAN) per tablet Take 1 tablet by mouth daily.      Donita Brooks ointment      Current Facility-Administered Medications on File Prior to Visit  Medication Dose Route Frequency Provider Last Rate Last Dose  . 0.9 %  sodium chloride infusion  500 mL Intravenous Continuous Gatha Mayer, MD        Allergies  Allergen Reactions  . Sulfa Antibiotics Rash  . Levofloxacin     Joint pain    Objective: Physical Exam General: The patient is alert and oriented x3 in no acute distress.  Dermatology: Skin is warm, dry and supple bilateral lower extremities. Nails 1-10 are normal. There is no erythema, chronic fatty edema to legs and feet, no eccymosis, no open lesions present. Integument is otherwise unremarkable.  Vascular: Dorsalis Pedis pulse and Posterior Tibial pulse are 1/4 bilateral. Capillary fill time is immediate to all digits.  Neurological: Grossly intact to light touch with an achilles reflex of +2/5 and a  negative Tinel's sign bilateral.  Musculoskeletal: No reproducible tenderness to palpation at the medial calcaneal tubercale and through the insertion of the plantar  fascia on the left foot. No pain with compression of calcaneus bilateral. No pain with tuning fork to calcaneus bilateral. No pain with calf compression bilateral. There is decreased Ankle joint range of motion bilateral. All other joints range of motion within normal limits bilateral. Strength 5/5 in all groups bilateral.   Assessment and Plan: Problem List Items Addressed This Visit    None    Visit Diagnoses    Plantar fasciitis    -  Primary   Inflammatory heel pain, left         -Complete examination performed.  -Re-Discussed with patient in detail the condition of plantar fasciitis and long term care -Patient still has meloxicam did not take it; I advised patient if she has a flareup of symptoms to start meloxicam to help -Encouraged patient to continue with heel padding and good supportive shoes, shoe list was given  -Recommend patient to resume gentle stretching exercises and to avoid high impact exercise like treadmill at an incline -Recommend patient to ice affected area, If flareup occurs -Patient to return to office as needed or sooner if condition recurs.  Landis Martins, DPM

## 2017-02-14 NOTE — Patient Instructions (Signed)
For tennis shoes recommend:  Brooks Beast Ascis New balance Saucony Can be purchased at [employer] or [employer]  Vionic  SAS Can be purchased at [employer] or [employer]   For work shoes recommend: Sketchers Work Timberland boots  Can be purchased at a variety of places or [employer]   For casual shoes recommend: Vionic  Can be purchased at [employer] or [employer]  

## 2017-02-20 ENCOUNTER — Telehealth: Payer: Self-pay | Admitting: *Deleted

## 2017-02-20 MED ORDER — MELOXICAM 15 MG PO TABS: 15.0000 mg | ORAL_TABLET | Freq: Every day | ORAL | 5 refills | Status: DC

## 2017-02-20 NOTE — Telephone Encounter (Signed)
Received refill request meloxicam. Dr. Marta Antu refill +5, needs an appt prior to future refills.

## 2017-10-31 ENCOUNTER — Other Ambulatory Visit: Payer: Self-pay

## 2017-10-31 DIAGNOSIS — Z0000 Encounter for general adult medical examination without abnormal findings: Secondary | ICD-10-CM

## 2017-10-31 DIAGNOSIS — Z8542 Personal history of malignant neoplasm of other parts of uterus: Secondary | ICD-10-CM

## 2017-10-31 DIAGNOSIS — Z86010 Personal history of colonic polyps: Secondary | ICD-10-CM

## 2017-10-31 DIAGNOSIS — Z87442 Personal history of urinary calculi: Secondary | ICD-10-CM

## 2017-10-31 DIAGNOSIS — E7800 Pure hypercholesterolemia, unspecified: Secondary | ICD-10-CM

## 2017-11-16 ENCOUNTER — Other Ambulatory Visit: Payer: Managed Care, Other (non HMO) | Admitting: Internal Medicine

## 2017-11-16 DIAGNOSIS — E7800 Pure hypercholesterolemia, unspecified: Secondary | ICD-10-CM

## 2017-11-16 DIAGNOSIS — Z86010 Personal history of colonic polyps: Secondary | ICD-10-CM

## 2017-11-16 DIAGNOSIS — Z8542 Personal history of malignant neoplasm of other parts of uterus: Secondary | ICD-10-CM

## 2017-11-16 DIAGNOSIS — Z87442 Personal history of urinary calculi: Secondary | ICD-10-CM

## 2017-11-16 DIAGNOSIS — Z0000 Encounter for general adult medical examination without abnormal findings: Secondary | ICD-10-CM

## 2017-11-16 LAB — COMPLETE METABOLIC PANEL WITH GFR
AG Ratio: 1.6 (calc) (ref 1.0–2.5)
ALBUMIN MSPROF: 3.9 g/dL (ref 3.6–5.1)
ALT: 16 U/L (ref 6–29)
AST: 14 U/L (ref 10–35)
Alkaline phosphatase (APISO): 88 U/L (ref 33–130)
BUN: 17 mg/dL (ref 7–25)
CALCIUM: 8.7 mg/dL (ref 8.6–10.4)
CO2: 28 mmol/L (ref 20–32)
Chloride: 106 mmol/L (ref 98–110)
Creat: 0.74 mg/dL (ref 0.50–0.99)
GFR, EST AFRICAN AMERICAN: 96 mL/min/{1.73_m2} (ref >=60)
GFR, EST NON AFRICAN AMERICAN: 83 mL/min/{1.73_m2} (ref >=60)
GLUCOSE: 84 mg/dL (ref 65–99)
Globulin: 2.4 g/dL (calc) (ref 1.9–3.7)
Potassium: 4.3 mmol/L (ref 3.5–5.3)
Sodium: 140 mmol/L (ref 135–146)
TOTAL PROTEIN: 6.3 g/dL (ref 6.1–8.1)
Total Bilirubin: 0.4 mg/dL (ref 0.2–1.2)

## 2017-11-16 LAB — CBC WITH DIFFERENTIAL/PLATELET
BASOS ABS: 29 {cells}/uL (ref 0–200)
Basophils Relative: 0.6 %
Eosinophils Absolute: 91 cells/uL (ref 15–500)
Eosinophils Relative: 1.9 %
HEMATOCRIT: 41.9 % (ref 35.0–45.0)
Hemoglobin: 14.3 g/dL (ref 11.7–15.5)
LYMPHS ABS: 960 {cells}/uL (ref 850–3900)
MCH: 29.9 pg (ref 27.0–33.0)
MCHC: 34.1 g/dL (ref 32.0–36.0)
MCV: 87.5 fL (ref 80.0–100.0)
MPV: 10.1 fL (ref 7.5–12.5)
Monocytes Relative: 10.4 %
NEUTROS ABS: 3221 {cells}/uL (ref 1500–7800)
NEUTROS PCT: 67.1 %
Platelets: 275 10*3/uL (ref 140–400)
RBC: 4.79 10*6/uL (ref 3.80–5.10)
RDW: 11.8 % (ref 11.0–15.0)
Total Lymphocyte: 20 %
WBC: 4.8 10*3/uL (ref 3.8–10.8)
WBCMIX: 499 {cells}/uL (ref 200–950)

## 2017-11-16 LAB — LIPID PANEL
Cholesterol: 171 mg/dL (ref <200)
HDL: 43 mg/dL — ABNORMAL LOW (ref >50)
LDL Cholesterol (Calc): 110 mg/dL (calc) — ABNORMAL HIGH
Non-HDL Cholesterol (Calc): 128 mg/dL (calc) (ref <130)
TRIGLYCERIDES: 90 mg/dL (ref <150)
Total CHOL/HDL Ratio: 4 (calc) (ref <5.0)

## 2017-11-16 LAB — TSH: TSH: 0.7 m[IU]/L (ref 0.40–4.50)

## 2017-11-19 ENCOUNTER — Ambulatory Visit (INDEPENDENT_AMBULATORY_CARE_PROVIDER_SITE_OTHER): Payer: Managed Care, Other (non HMO) | Admitting: Internal Medicine

## 2017-11-19 ENCOUNTER — Encounter: Payer: Self-pay | Admitting: Internal Medicine

## 2017-11-19 VITALS — BP 130/80 | HR 68 | Ht 62.5 in | Wt 221.0 lb

## 2017-11-19 DIAGNOSIS — Z87442 Personal history of urinary calculi: Secondary | ICD-10-CM

## 2017-11-19 DIAGNOSIS — Z8542 Personal history of malignant neoplasm of other parts of uterus: Secondary | ICD-10-CM | POA: Diagnosis not present

## 2017-11-19 DIAGNOSIS — Z6839 Body mass index (BMI) 39.0-39.9, adult: Secondary | ICD-10-CM | POA: Diagnosis not present

## 2017-11-19 DIAGNOSIS — Z0000 Encounter for general adult medical examination without abnormal findings: Secondary | ICD-10-CM | POA: Diagnosis not present

## 2017-11-19 DIAGNOSIS — R8290 Unspecified abnormal findings in urine: Secondary | ICD-10-CM

## 2017-11-19 LAB — POCT URINALYSIS DIPSTICK
Appearance: NORMAL
Bilirubin, UA: NEGATIVE
GLUCOSE UA: NEGATIVE
KETONES UA: NEGATIVE
NITRITE UA: NEGATIVE
Odor: NORMAL
PROTEIN UA: NEGATIVE
SPEC GRAV UA: 1.015 (ref 1.010–1.025)
Urobilinogen, UA: 0.2 E.U./dL
pH, UA: 6 (ref 5.0–8.0)

## 2017-11-19 NOTE — Progress Notes (Signed)
Subjective:    Patient ID: Audrey Yoder, female    DOB: 05-03-1951, 67 y.o.   MRN: [id number]  HPI 67 year old Female for health maintenance exam and evaluation of medical issues.  She has a history of kidney stones has not seen urologist.. She recently had an issue with  suprapubic abdominal pain with nausea that was rather severe and kept her up one evening.  No frank hematuria.  She has well water..  It is possible she passed a stone.  Today she has 0-2 white blood cells per high-powered field.  Culture had multiple species.  Will ask urologist  to consider whether or not she needs to have cystoscopy and/or repeat CT  I would like for her to see Urologist for evaluation.  She has not had CT abdomen and pelvis for evaluation since January 2016.  At that time she had had an episode of passing 2 stones in November 2015 with hematuria.  She feels like recent episode was a kidney stone.  In 2015 she had a stone analysis.  Stone was 20% calcium oxalate and 80% uric acid.  Uric acid level at that time was 6.9.  She is not on medication for uric acid.  She was diagnosed with uterine cancer in late 2010 and underwent modified radical hysterectomy with BSO and bilateral lymph node dissection January 2011 at [facility].  She had negative nodes.  Overall her surgery staging was 1-B.  She had vaginal cuff brachii therapy for 3 doses 1 month apart as adjuvant therapy.  Subsequently has been released from follow-up with no further issues.  History of left knee osteoarthritis 2008 and had steroid injection by orthopedist.    Vitamin D level checked in 2018 was normal.    In 2014 she was treated for presumed tickborne illness at an urgent care center and [place].  She reported flulike symptoms at the time after having a recent tick bite.  Social history: She has a JD degree and is married.  She is a self-employed Forensic psychologist.  She does not smoke.  Drinks wine infrequently.  Has 2 adult sons and  grandchildren.  Her husband is a diabetic and sometimes has hypoglycemic reactions.  Family history: Father with history of myeloproliferative disorder.  Mother with history of stroke.  One brother with history of hemochromatosis.    Review of Systems  recent suprapubic pain episode see above.  Otherwise no new complaints.     Objective:   Physical Exam  Constitutional: She is oriented to person, place, and time. She appears well-developed and well-nourished.  HENT:  Head: Normocephalic and atraumatic.  Right Ear: External ear normal.  Left Ear: External ear normal.  Mouth/Throat: Oropharynx is clear and moist.  Eyes: Pupils are equal, round, and reactive to light. EOM are normal. Right eye exhibits no discharge. Left eye exhibits no discharge.  Neck: No JVD present. No thyromegaly present.  Cardiovascular: Normal rate, regular rhythm and normal heart sounds. Exam reveals no gallop and no friction rub.  No murmur heard. Pulmonary/Chest: Effort normal. No stridor. No respiratory distress. She has no wheezes.  Abdominal: Soft. Bowel sounds are normal. She exhibits no distension and no mass. There is no guarding.  Genitourinary:  Genitourinary Comments: Deferred status post TAH/BSO  Lymphadenopathy:    She has no cervical adenopathy.  Neurological: She is alert and oriented to person, place, and time. She displays normal reflexes. No cranial nerve deficit or sensory deficit. She exhibits normal muscle tone. Coordination  normal.  Skin: Skin is warm and dry. No erythema.  Psychiatric: She has a normal mood and affect. Her behavior is normal. Judgment and thought content normal.  Vitals reviewed.         Assessment & Plan:  History of uterine cancer status post TAH/BSO and treatment 2012-declared disease-free  Episode recently of suprapubic pain with prior history of kidney stones.  Referred to urologist to see if she has issues with  recurrent stones.  Urine dipstick showed  moderate occult blood however when specimen was sent to lab microscopic only showed 0-2 red blood cells per high-powered field.  She had 10-20 squamous epithelial cells per high-powered field.  Culture showed multiple species.  Obesity-discussed diet and exercise.  Elevated LDL cholesterol of 110-discussed diet exercise and weight loss.  Family history of hemochromatosis-her CBC is normal  Plan: Urology referral.  Continue diet and exercise and efforts at weight loss.  Return in 1 year or as needed.

## 2017-11-20 LAB — URINE CULTURE
MICRO NUMBER:: 90667420
SPECIMEN QUALITY:: ADEQUATE

## 2017-11-20 LAB — URINALYSIS, MICROSCOPIC ONLY
Bacteria, UA: NONE SEEN /HPF
HYALINE CAST: NONE SEEN /LPF

## 2017-12-09 ENCOUNTER — Encounter: Payer: Self-pay | Admitting: Internal Medicine

## 2017-12-09 NOTE — Patient Instructions (Signed)
Try to work on diet exercise and weight loss.  Referral made to [facility] urology regarding kidney stones and recent episode of suprapubic pain.  Return in 1 year or as needed.

## 2018-01-01 ENCOUNTER — Encounter: Payer: Self-pay | Admitting: Internal Medicine

## 2018-05-06 DIAGNOSIS — H01024 Squamous blepharitis left upper eyelid: Secondary | ICD-10-CM | POA: Diagnosis not present

## 2018-05-06 DIAGNOSIS — H01022 Squamous blepharitis right lower eyelid: Secondary | ICD-10-CM | POA: Diagnosis not present

## 2018-05-06 DIAGNOSIS — H01021 Squamous blepharitis right upper eyelid: Secondary | ICD-10-CM | POA: Diagnosis not present

## 2018-05-06 DIAGNOSIS — H01025 Squamous blepharitis left lower eyelid: Secondary | ICD-10-CM | POA: Diagnosis not present

## 2018-06-21 DIAGNOSIS — D485 Neoplasm of uncertain behavior of skin: Secondary | ICD-10-CM | POA: Diagnosis not present

## 2018-06-21 DIAGNOSIS — Z808 Family history of malignant neoplasm of other organs or systems: Secondary | ICD-10-CM | POA: Diagnosis not present

## 2018-06-21 DIAGNOSIS — Z85828 Personal history of other malignant neoplasm of skin: Secondary | ICD-10-CM | POA: Diagnosis not present

## 2018-06-21 DIAGNOSIS — L280 Lichen simplex chronicus: Secondary | ICD-10-CM | POA: Diagnosis not present

## 2018-06-21 DIAGNOSIS — L821 Other seborrheic keratosis: Secondary | ICD-10-CM | POA: Diagnosis not present

## 2018-06-21 DIAGNOSIS — L918 Other hypertrophic disorders of the skin: Secondary | ICD-10-CM | POA: Diagnosis not present

## 2018-06-21 DIAGNOSIS — L570 Actinic keratosis: Secondary | ICD-10-CM | POA: Diagnosis not present

## 2018-08-01 ENCOUNTER — Encounter: Payer: Self-pay | Admitting: Internal Medicine

## 2018-08-01 ENCOUNTER — Ambulatory Visit (INDEPENDENT_AMBULATORY_CARE_PROVIDER_SITE_OTHER): Payer: BLUE CROSS/BLUE SHIELD | Admitting: Internal Medicine

## 2018-08-01 VITALS — BP 130/80 | HR 88 | Temp 98.5°F | Ht 62.5 in | Wt 224.0 lb

## 2018-08-01 DIAGNOSIS — H8110 Benign paroxysmal vertigo, unspecified ear: Secondary | ICD-10-CM | POA: Diagnosis not present

## 2018-08-01 DIAGNOSIS — H6692 Otitis media, unspecified, left ear: Secondary | ICD-10-CM | POA: Diagnosis not present

## 2018-08-01 MED ORDER — MECLIZINE HCL 25 MG PO TABS: 25.0000 mg | ORAL_TABLET | Freq: Three times a day (TID) | ORAL | 0 refills | Status: DC | PRN

## 2018-08-01 MED ORDER — AZITHROMYCIN 250 MG PO TABS: ORAL_TABLET | ORAL | 0 refills | Status: DC

## 2018-08-01 NOTE — Progress Notes (Signed)
   Subjective:    Patient ID: Audrey Yoder, female    DOB: 1951/01/23, 68 y.o.   MRN: [id number]  HPI Patient had flulike illness but did not seek medical attention some 4 weeks ago.  Grandchildren were diagnosed with the flu.  She had myalgias malaise and fatigue.  Subsequently developed a cough which has persisted.  Recently developed symptoms of vertigo with dizziness but no vomiting.  Thinks she may have an ear infection.  Left ear has been hurting    Review of Systems complains of inability to focus her eyes if she turns her head too quickly     Objective:   Physical Exam  Vital signs reviewed.  Skin warm and dry.  Nodes none.  Neck is supple without thyromegaly.  Chest is clear to auscultation.  No nystagmus is demonstrated today No facial asymmetry or weakness.  Left TM injected.  Pharynx is clear.  Right TM okay.  She is alert and oriented.     Assessment & Plan:  Likely benign positional vertigo-treat with meclizine up to 25 mg 3 times daily as needed.  Otitis media-treat with Zithromax Z-PAK take 2 tablets p.o. day 1 followed by 1 tablet p.o. days 2 through 5.

## 2018-08-01 NOTE — Patient Instructions (Signed)
Take Zithromax Z-PAK as directed.  Take meclizine 25 mg up to 3 times daily as needed for vertigo.  Call if not better in 7 to 10 days or sooner if worse.

## 2018-11-18 ENCOUNTER — Other Ambulatory Visit: Payer: BLUE CROSS/BLUE SHIELD | Admitting: Internal Medicine

## 2018-11-18 DIAGNOSIS — E7800 Pure hypercholesterolemia, unspecified: Secondary | ICD-10-CM

## 2018-11-18 DIAGNOSIS — Z0000 Encounter for general adult medical examination without abnormal findings: Secondary | ICD-10-CM | POA: Diagnosis not present

## 2018-11-19 LAB — COMPLETE METABOLIC PANEL WITH GFR
AG Ratio: 1.7 (calc) (ref 1.0–2.5)
ALT: 15 U/L (ref 6–29)
AST: 15 U/L (ref 10–35)
Albumin: 4.3 g/dL (ref 3.6–5.1)
Alkaline phosphatase (APISO): 91 U/L (ref 37–153)
BUN: 18 mg/dL (ref 7–25)
CO2: 27 mmol/L (ref 20–32)
Calcium: 9.2 mg/dL (ref 8.6–10.4)
Chloride: 105 mmol/L (ref 98–110)
Creat: 0.92 mg/dL (ref 0.50–0.99)
GFR, Est African American: 74 mL/min/{1.73_m2} (ref >=60)
GFR, Est Non African American: 64 mL/min/{1.73_m2} (ref >=60)
Globulin: 2.5 g/dL (calc) (ref 1.9–3.7)
Glucose, Bld: 86 mg/dL (ref 65–99)
Potassium: 4.6 mmol/L (ref 3.5–5.3)
Sodium: 141 mmol/L (ref 135–146)
Total Bilirubin: 0.5 mg/dL (ref 0.2–1.2)
Total Protein: 6.8 g/dL (ref 6.1–8.1)

## 2018-11-19 LAB — CBC WITH DIFFERENTIAL/PLATELET
Absolute Monocytes: 461 cells/uL (ref 200–950)
Basophils Absolute: 19 cells/uL (ref 0–200)
Basophils Relative: 0.4 %
Eosinophils Absolute: 101 cells/uL (ref 15–500)
Eosinophils Relative: 2.1 %
HCT: 45.8 % — ABNORMAL HIGH (ref 35.0–45.0)
Hemoglobin: 15.3 g/dL (ref 11.7–15.5)
Lymphs Abs: 1013 cells/uL (ref 850–3900)
MCH: 30.2 pg (ref 27.0–33.0)
MCHC: 33.4 g/dL (ref 32.0–36.0)
MCV: 90.3 fL (ref 80.0–100.0)
MPV: 9.9 fL (ref 7.5–12.5)
Monocytes Relative: 9.6 %
Neutro Abs: 3206 cells/uL (ref 1500–7800)
Neutrophils Relative %: 66.8 %
Platelets: 264 10*3/uL (ref 140–400)
RBC: 5.07 10*6/uL (ref 3.80–5.10)
RDW: 12.3 % (ref 11.0–15.0)
Total Lymphocyte: 21.1 %
WBC: 4.8 10*3/uL (ref 3.8–10.8)

## 2018-11-19 LAB — LIPID PANEL
Cholesterol: 184 mg/dL (ref <200)
HDL: 48 mg/dL — ABNORMAL LOW (ref >=50)
LDL Cholesterol (Calc): 114 mg/dL (calc) — ABNORMAL HIGH
Non-HDL Cholesterol (Calc): 136 mg/dL (calc) — ABNORMAL HIGH (ref <130)
Total CHOL/HDL Ratio: 3.8 (calc) (ref <5.0)
Triglycerides: 117 mg/dL (ref <150)

## 2018-11-19 LAB — TSH: TSH: 0.99 mIU/L (ref 0.40–4.50)

## 2018-11-21 ENCOUNTER — Encounter: Payer: Self-pay | Admitting: Internal Medicine

## 2018-11-21 ENCOUNTER — Ambulatory Visit (INDEPENDENT_AMBULATORY_CARE_PROVIDER_SITE_OTHER): Payer: BLUE CROSS/BLUE SHIELD | Admitting: Internal Medicine

## 2018-11-21 VITALS — BP 130/80 | HR 82 | Ht 62.5 in | Wt 221.0 lb

## 2018-11-21 DIAGNOSIS — Z20828 Contact with and (suspected) exposure to other viral communicable diseases: Secondary | ICD-10-CM | POA: Diagnosis not present

## 2018-11-21 DIAGNOSIS — Z0000 Encounter for general adult medical examination without abnormal findings: Secondary | ICD-10-CM | POA: Diagnosis not present

## 2018-11-21 DIAGNOSIS — K625 Hemorrhage of anus and rectum: Secondary | ICD-10-CM | POA: Diagnosis not present

## 2018-11-21 DIAGNOSIS — Z860100 Personal history of colon polyps, unspecified: Secondary | ICD-10-CM

## 2018-11-21 DIAGNOSIS — Z6839 Body mass index (BMI) 39.0-39.9, adult: Secondary | ICD-10-CM

## 2018-11-21 DIAGNOSIS — Z8349 Family history of other endocrine, nutritional and metabolic diseases: Secondary | ICD-10-CM

## 2018-11-21 DIAGNOSIS — Z20822 Contact with and (suspected) exposure to covid-19: Secondary | ICD-10-CM

## 2018-11-21 DIAGNOSIS — Z131 Encounter for screening for diabetes mellitus: Secondary | ICD-10-CM | POA: Diagnosis not present

## 2018-11-21 DIAGNOSIS — K921 Melena: Secondary | ICD-10-CM | POA: Diagnosis not present

## 2018-11-21 DIAGNOSIS — Z87442 Personal history of urinary calculi: Secondary | ICD-10-CM | POA: Diagnosis not present

## 2018-11-21 DIAGNOSIS — Z8542 Personal history of malignant neoplasm of other parts of uterus: Secondary | ICD-10-CM | POA: Diagnosis not present

## 2018-11-21 DIAGNOSIS — E7800 Pure hypercholesterolemia, unspecified: Secondary | ICD-10-CM

## 2018-11-21 LAB — POCT URINALYSIS DIPSTICK
Appearance: NEGATIVE
Bilirubin, UA: NEGATIVE
Blood, UA: NEGATIVE
Glucose, UA: NEGATIVE
Ketones, UA: NEGATIVE
Leukocytes, UA: NEGATIVE
Nitrite, UA: NEGATIVE
Odor: NEGATIVE
Protein, UA: NEGATIVE
Spec Grav, UA: 1.015 (ref 1.010–1.025)
Urobilinogen, UA: 0.2 E.U./dL
pH, UA: 6.5 (ref 5.0–8.0)

## 2018-11-21 LAB — SAR COV2 SEROLOGY (COVID19)AB(IGG),IA: SARS CoV2 AB IGG: NEGATIVE

## 2018-11-21 NOTE — Progress Notes (Signed)
Subjective:    Patient ID: Audrey Yoder, female    DOB: 12-13-1950, 68 y.o.   MRN: [id number]  HPI 68 year old Female in today for health maintenance exam and evaluation of medical issues.  Patient requested antibody testing for COVID-19.  Thinks she may have been exposed to a number of weeks ago.  This was performed and proved to be negative.  Patient was diagnosed with uterine cancer July 2010 and underwent modified radical hysterectomy with BSO and bilateral lymph node dissection January 2011 at [facility].  She had negative nodes.  Her stage was 1-me.  She had vaginal cuff brachii therapy for 3 doses 1 month apart as adjuvant therapy.  She has been released from follow-up and said no further issues.  History of left knee osteoarthritis 2008 treated with steroid injection by orthopedist.  In 2014 she was treated for presumed tickborne illness at an urgent care center in [place].  She reported flulike symptoms of time after having a recent tick bite.  History of kidney stones.  Has not seen urologist in consultation.  Generally has not sought medical care when she had an attack but basically rotted out.  Certainly sounds like kidney stones by history.  She has well water.  Last serious attack was in 2019.  She has not had a CT abdomen and pelvis since 2016.  She did have a kidney stone in 2015 and had stone analysis through this office.  Stone was predominantly uric acid.  The stone was 20% calcium oxalate.  Patient subsequently had uric acid drawn and level was high normal at 6.9.  She has never had gout.  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  She does not smoke.  Occasional wine consumption.  She has 2 adult sons and grandchildren.  Her husband is a diabetic.  Family history: Father with history of myeloproliferative disorder.  Mother with history of stroke.  One brother with history of hemochromatosis.  We screened her for this today and her ferritin  was normal at 71.    Had colonoscopy in 2017 with Dr. Carlean Purl.    Review of Systems no new complaints     Objective:   Physical Exam Blood pressure 130/80, pulse 82 regular; weight 221 pounds, BMI 39.78.  Skin warm and dry.  Nodes none.  Neck is supple without JVD thyromegaly or carotid bruits.  Chest clear to auscultation.  Cardiac exam regular rate and rhythm normal S1 and S2.  Abdomen is soft nondistended without hepatosplenomegaly masses or tenderness.  Extremities without pitting edema.  Pelvic exam deferred due to hysterectomy and BSO in 2010 due to uterine cancer.  Neuro no focal deficits.  Affect is normal.       Assessment & Plan:  BMI 39.78- needs to work on diet and exercise.  Have recommended Dr. Migdalia Dk clinic.  History of uterine cancer status post TAH/BSO 2012 and disease.  History of kidney stone-had stone analysis in the past.  Stone was predominantly uric acid with 20% calcium oxalate.  Uric acid has been within normal limits.  We can consider allopurinol to prevent further kidney stones.  She has not seen urologist as I suggested.  Family history of hemochromatosis-ferritin is normal  Low HDL cholesterol 48  LDL is mildly elevated at 114.  Total cholesterol is normal at 184.  She does not want to be on statin therapy.  Plan: Try to work on diet exercise and weight loss.  COVID-19 antibody  test is negative.  Glucose, kidney functions liver functions normal.  Mild elevation of LDL.  Has low HDL cholesterol.  Return in 1 year or as needed.  Recommend annual flu vaccine in fall.

## 2018-11-22 LAB — FERRITIN: Ferritin: 71 ng/mL (ref 16–288)

## 2018-11-22 LAB — HEMOGLOBIN A1C
Hgb A1c MFr Bld: 5.3 % of total Hgb (ref <5.7)
Mean Plasma Glucose: 105 (calc)
eAG (mmol/L): 5.8 (calc)

## 2018-12-15 NOTE — Patient Instructions (Signed)
Please work on diet exercise and weight loss.  Return in 1 year or as needed.  It was a pleasure to see you today.  Screen for hemochromatosis and COVID-19 antibody are negative

## 2019-03-24 ENCOUNTER — Telehealth: Payer: Self-pay | Admitting: Internal Medicine

## 2019-03-24 NOTE — Telephone Encounter (Signed)
Called and let Pam no we do not have access to rapid COVID testing so she is going to call around and will call back if she still needs to get one thru [place].

## 2019-03-24 NOTE — Telephone Encounter (Signed)
Audrey Yoder [phone]  Janella called to say she has a cough and nasal drainage, she had low grade fever towards the end of last week. Her 69 year old grandson came over with bad cold and he has been back to preschool. No exposure to COVID that she knows of. She needs to have Rapid COVID test because [facility] [place] [facility] [place] [facility] [place] [facility].

## 2019-03-24 NOTE — Telephone Encounter (Signed)
We do not have a rapid Covid test and neither does [facility]. She may want to call around to various urgent care facilities and see what is available. I understand [facility] may have one.

## 2019-04-04 ENCOUNTER — Encounter: Payer: Self-pay | Admitting: Internal Medicine

## 2019-10-07 DIAGNOSIS — Z1231 Encounter for screening mammogram for malignant neoplasm of breast: Secondary | ICD-10-CM | POA: Diagnosis not present

## 2019-10-07 LAB — HM MAMMOGRAPHY

## 2019-10-09 ENCOUNTER — Encounter: Payer: Self-pay | Admitting: Internal Medicine

## 2019-11-24 ENCOUNTER — Other Ambulatory Visit: Payer: Self-pay

## 2019-11-24 ENCOUNTER — Other Ambulatory Visit: Payer: BC Managed Care – PPO | Admitting: Internal Medicine

## 2019-11-24 DIAGNOSIS — Z1329 Encounter for screening for other suspected endocrine disorder: Secondary | ICD-10-CM | POA: Diagnosis not present

## 2019-11-24 DIAGNOSIS — E669 Obesity, unspecified: Secondary | ICD-10-CM

## 2019-11-24 DIAGNOSIS — Z0000 Encounter for general adult medical examination without abnormal findings: Secondary | ICD-10-CM | POA: Diagnosis not present

## 2019-11-24 DIAGNOSIS — E7800 Pure hypercholesterolemia, unspecified: Secondary | ICD-10-CM

## 2019-11-25 ENCOUNTER — Ambulatory Visit (INDEPENDENT_AMBULATORY_CARE_PROVIDER_SITE_OTHER): Payer: BC Managed Care – PPO | Admitting: Internal Medicine

## 2019-11-25 ENCOUNTER — Encounter: Payer: Self-pay | Admitting: Internal Medicine

## 2019-11-25 VITALS — BP 120/78 | HR 68 | Ht 62.5 in | Wt 224.0 lb

## 2019-11-25 DIAGNOSIS — Z6841 Body Mass Index (BMI) 40.0 and over, adult: Secondary | ICD-10-CM

## 2019-11-25 DIAGNOSIS — Z0000 Encounter for general adult medical examination without abnormal findings: Secondary | ICD-10-CM

## 2019-11-25 DIAGNOSIS — E786 Lipoprotein deficiency: Secondary | ICD-10-CM | POA: Diagnosis not present

## 2019-11-25 DIAGNOSIS — Z23 Encounter for immunization: Secondary | ICD-10-CM

## 2019-11-25 LAB — POCT URINALYSIS DIPSTICK
Appearance: NEGATIVE
Bilirubin, UA: NEGATIVE
Blood, UA: NEGATIVE
Glucose, UA: NEGATIVE
Ketones, UA: NEGATIVE
Leukocytes, UA: NEGATIVE
Nitrite, UA: NEGATIVE
Odor: NEGATIVE
Protein, UA: NEGATIVE
Spec Grav, UA: 1.015 (ref 1.010–1.025)
Urobilinogen, UA: 0.2 E.U./dL
pH, UA: 5 (ref 5.0–8.0)

## 2019-11-25 NOTE — Progress Notes (Signed)
Subjective:    Patient ID: Audrey Yoder, female    DOB: 12-05-1950, 69 y.o.   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  Has had Shingrix vaccine but I do not think she has had Zostavax vaccine.  Tetanus immunizations up-to-date.  Has had 2 pneumococcal vaccines and has had Moderna COVID-19 vaccines x2.  History of uterine cancer July 2010 and underwent modified radical hysterectomy with BSO with bilateral lymph node dissection January 2011 at [facility].  She had negative nodes.  She had vaginal cuff brachytherapy for 3 doses 1 month apart as adjuvant therapy.  She has been released from follow-up and has had no further issues.  History of left knee osteoarthritis 2008 treated with steroid injection by orthopedist  In 2014 she was treated for presumed tickborne illness at an urgent care center in [place].  She reported flulike symptoms at the time after having a recent tick bite.  History of kidney stones and has not seen urologist.  Usually does not seek medical care when she has an attack but basically rides it out.  She has well water.  Last serious attack was 2019.  Has not had a CT of abdomen and pelvis since 2016.  She did have a kidney stone in 2015 and had stone analysis through this office.  Stone was predominantly uric acid with 20% calcium oxalate.  She subsequently had uric acid level drawn and it was high normal at 6.9 but is not on gallop medication and has never had gout.  Social history: She has a JD degree and practices in [place].  Does not smoke.  Occasional wine consumption.  She has 2 adult sons and grandchildren.  Her husband is a diabetic.  Family history: Father with history of myeloproliferative disorder.  Mother with history of stroke.  1 brother with history of hemochromatosis.  She has been screened for hemochromatosis in 2020 and her ferritin level was normal at 71.  Had colonoscopy in 2017 with Dr.  Carlean Purl.    Review of Systems  Constitutional: Negative.   Respiratory: Negative.   Cardiovascular: Negative.   Gastrointestinal: Negative.   Genitourinary: Negative.   Neurological: Negative.   Psychiatric/Behavioral: Negative.        Objective:   Physical Exam Constitutional:      Appearance: Normal appearance.  HENT:     Head: Normocephalic and atraumatic.     Right Ear: Tympanic membrane normal.     Left Ear: Tympanic membrane normal.     Nose: Nose normal.  Eyes:     General: No scleral icterus.    Pupils: Pupils are equal, round, and reactive to light.  Neck:     Comments: No thyromegaly or carotid bruits.  No cervical adenopathy. Cardiovascular:     Rate and Rhythm: Normal rate and regular rhythm.     Heart sounds: Normal heart sounds. No murmur heard.   Pulmonary:     Effort: Pulmonary effort is normal.     Breath sounds: Normal breath sounds. No wheezing.  Abdominal:     Palpations: Abdomen is soft. There is no mass.     Tenderness: There is no abdominal tenderness. There is no right CVA tenderness, left CVA tenderness, guarding or rebound.  Genitourinary:    Comments: Deferred Musculoskeletal:     Cervical back: Neck supple. No rigidity.     Right lower leg: No edema.     Left lower leg: No edema.  Lymphadenopathy:     Cervical: No cervical adenopathy.  Skin:    General: Skin is warm and dry.  Neurological:     General: No focal deficit present.     Mental Status: She is alert and oriented to person, place, and time.     Cranial Nerves: No cranial nerve deficit.     Coordination: Coordination normal.  Psychiatric:        Mood and Affect: Mood normal.        Behavior: Behavior normal.        Thought Content: Thought content normal.        Judgment: Judgment normal.           Assessment & Plan:  History of uterine cancer status post modified radical hysterectomy with BSO with bilateral lymph node dissection January 2011.  No recurrence.  Has  been released from follow-up.  BMI 40.32-encourage diet exercise and weight loss  History of kidney stones by history but has not seen urologist  Family history of hemochromatosis but last year her ferritin level was normal  Low HDL of 45  Very mild LDL elevation of 105  Plan: Continue to work with diet exercise and weight loss and follow-up in 1 year.  She takes no chronic medication.

## 2019-11-27 ENCOUNTER — Ambulatory Visit: Payer: BLUE CROSS/BLUE SHIELD | Admitting: Podiatry

## 2019-11-28 ENCOUNTER — Ambulatory Visit: Payer: BLUE CROSS/BLUE SHIELD | Admitting: Sports Medicine

## 2019-11-28 LAB — COMPLETE METABOLIC PANEL WITH GFR
AG Ratio: 1.6 (calc) (ref 1.0–2.5)
ALT: 15 U/L (ref 6–29)
AST: 13 U/L (ref 10–35)
Albumin: 4 g/dL (ref 3.6–5.1)
Alkaline phosphatase (APISO): 92 U/L (ref 37–153)
BUN: 17 mg/dL (ref 7–25)
CO2: 27 mmol/L (ref 20–32)
Calcium: 9 mg/dL (ref 8.6–10.4)
Chloride: 108 mmol/L (ref 98–110)
Creat: 0.76 mg/dL (ref 0.60–0.93)
GFR, Est African American: 92 mL/min/{1.73_m2} (ref >=60)
GFR, Est Non African American: 79 mL/min/{1.73_m2} (ref >=60)
Globulin: 2.5 g/dL (calc) (ref 1.9–3.7)
Glucose, Bld: 94 mg/dL (ref 65–99)
Potassium: 4.5 mmol/L (ref 3.5–5.3)
Sodium: 140 mmol/L (ref 135–146)
Total Bilirubin: 0.4 mg/dL (ref 0.2–1.2)
Total Protein: 6.5 g/dL (ref 6.1–8.1)

## 2019-11-28 LAB — CBC WITH DIFFERENTIAL/PLATELET
Absolute Monocytes: 465 cells/uL (ref 200–950)
Basophils Absolute: 19 cells/uL (ref 0–200)
Basophils Relative: 0.4 %
Eosinophils Absolute: 99 cells/uL (ref 15–500)
Eosinophils Relative: 2.1 %
HCT: 42.5 % (ref 35.0–45.0)
Hemoglobin: 14.5 g/dL (ref 11.7–15.5)
Lymphs Abs: 968 cells/uL (ref 850–3900)
MCH: 30.7 pg (ref 27.0–33.0)
MCHC: 34.1 g/dL (ref 32.0–36.0)
MCV: 89.9 fL (ref 80.0–100.0)
MPV: 9.7 fL (ref 7.5–12.5)
Monocytes Relative: 9.9 %
Neutro Abs: 3149 cells/uL (ref 1500–7800)
Neutrophils Relative %: 67 %
Platelets: 264 10*3/uL (ref 140–400)
RBC: 4.73 10*6/uL (ref 3.80–5.10)
RDW: 12.2 % (ref 11.0–15.0)
Total Lymphocyte: 20.6 %
WBC: 4.7 10*3/uL (ref 3.8–10.8)

## 2019-11-28 LAB — TEST AUTHORIZATION

## 2019-11-28 LAB — LIPID PANEL
Cholesterol: 175 mg/dL (ref <200)
HDL: 45 mg/dL — ABNORMAL LOW (ref >=50)
LDL Cholesterol (Calc): 105 mg/dL (calc) — ABNORMAL HIGH
Non-HDL Cholesterol (Calc): 130 mg/dL (calc) — ABNORMAL HIGH (ref <130)
Total CHOL/HDL Ratio: 3.9 (calc) (ref <5.0)
Triglycerides: 134 mg/dL (ref <150)

## 2019-11-28 LAB — HEMOGLOBIN A1C W/OUT EAG: Hgb A1c MFr Bld: 5.1 % of total Hgb (ref <5.7)

## 2019-11-28 LAB — TSH: TSH: 0.54 mIU/L (ref 0.40–4.50)

## 2019-12-02 ENCOUNTER — Ambulatory Visit: Payer: Self-pay

## 2019-12-02 ENCOUNTER — Encounter: Payer: Self-pay | Admitting: Orthopaedic Surgery

## 2019-12-02 ENCOUNTER — Ambulatory Visit: Payer: BC Managed Care – PPO | Admitting: Orthopaedic Surgery

## 2019-12-02 ENCOUNTER — Other Ambulatory Visit: Payer: Self-pay

## 2019-12-02 DIAGNOSIS — M76822 Posterior tibial tendinitis, left leg: Secondary | ICD-10-CM

## 2019-12-02 DIAGNOSIS — M25572 Pain in left ankle and joints of left foot: Secondary | ICD-10-CM | POA: Diagnosis not present

## 2019-12-02 NOTE — Progress Notes (Signed)
Office Visit Note   Patient: Audrey Yoder           Date of Birth: 12/03/1950           MRN: [id number] Visit Date: 12/02/2019              Requested by: Elby Showers, MD [address] [place],  [place] [address] PCP: Elby Showers, MD   Assessment & Plan: Visit Diagnoses:  1. Pain in left ankle and joints of left foot   2. Posterior tibial tendinitis, left leg     Plan: Impression is left posterior tibial tendinitis.  I have given her recommendations on using Voltaren gel as well as physical therapy for home exercises and I have directed her to getting a pair of off-the-shelf arch supports from either the shoe market or [employer].  Questions encouraged and answered.  Follow-up as needed.  Follow-Up Instructions: Return if symptoms worsen or fail to improve.   Orders:  Orders Placed This Encounter  Procedures  . XR Ankle Complete Left  . Ambulatory referral to Physical Therapy   No orders of the defined types were placed in this encounter.     Procedures: No procedures performed   Clinical Data: No additional findings.   Subjective: Chief Complaint  Patient presents with  . Left Ankle - Pain    Ms. Hutt is a 69 year old female who is a wife of Audrey Yoder who is a longtime patient of mine who comes in for evaluation of posterior medial left ankle pain without injury.  She has had a history of plantar fasciitis but this feels very different.  She states that the pain is worse with standing barefoot and with walking.  She feels like the pain is slightly improving.  She has swelling as well.   Review of Systems  Constitutional: Negative.   HENT: Negative.   Eyes: Negative.   Respiratory: Negative.   Cardiovascular: Negative.   Endocrine: Negative.   Musculoskeletal: Negative.   Neurological: Negative.   Hematological: Negative.   Psychiatric/Behavioral: Negative.   All other systems reviewed and are negative.    Objective: Vital  Signs: There were no vitals taken for this visit.  Physical Exam Vitals and nursing note reviewed.  Constitutional:      Appearance: She is well-developed.  HENT:     Head: Normocephalic and atraumatic.  Pulmonary:     Effort: Pulmonary effort is normal.  Abdominal:     Palpations: Abdomen is soft.  Musculoskeletal:     Cervical back: Neck supple.  Skin:    General: Skin is warm.     Capillary Refill: Capillary refill takes less than 2 seconds.  Neurological:     Mental Status: She is alert and oriented to person, place, and time.  Psychiatric:        Behavior: Behavior normal.        Thought Content: Thought content normal.        Judgment: Judgment normal.     Ortho Exam Examination of the left ankle on foot shows no malalignments.  She does not demonstrate any significant flatfoot deformities.  She is able to perform a single heel raise without difficulty.  She is tender along the posterior tibial tendon mainly around the medial malleolus and just distal to it. Specialty Comments:  No specialty comments available.  Imaging: No results found.   PMFS History: Patient Active Problem List   Diagnosis Date Noted  . Hx of colonic polyps  03/31/2016  . Kidney stones 06/15/2014  . History of vitamin D deficiency 01/13/2012  . Elevated LDL cholesterol level 01/13/2012  . History of uterine cancer 01/13/2012  . Obesity 12/29/2010   Past Medical History:  Diagnosis Date  . Endometrial ca ([place])   . Hx of colonic polyps 03/31/2016    Family History  Problem Relation Age of Onset  . Stroke Mother     Past Surgical History:  Procedure Laterality Date  . ABDOMINAL HYSTERECTOMY    . TOTAL VAGINAL HYSTERECTOMY     Social History   Occupational History  . Not on file  Tobacco Use  . Smoking status: Never Smoker  . Smokeless tobacco: Never Used  Substance and Sexual Activity  . Alcohol use: Yes    Alcohol/week: 2.0 standard drinks    Types: 2 Glasses of wine per  week  . Drug use: No  . Sexual activity: Not on file

## 2019-12-12 ENCOUNTER — Ambulatory Visit (INDEPENDENT_AMBULATORY_CARE_PROVIDER_SITE_OTHER): Payer: BC Managed Care – PPO | Admitting: Rehabilitative and Restorative Service Providers"

## 2019-12-12 ENCOUNTER — Other Ambulatory Visit: Payer: Self-pay

## 2019-12-12 ENCOUNTER — Encounter: Payer: Self-pay | Admitting: Rehabilitative and Restorative Service Providers"

## 2019-12-12 DIAGNOSIS — M25572 Pain in left ankle and joints of left foot: Secondary | ICD-10-CM | POA: Diagnosis not present

## 2019-12-12 DIAGNOSIS — R262 Difficulty in walking, not elsewhere classified: Secondary | ICD-10-CM | POA: Diagnosis not present

## 2019-12-12 NOTE — Patient Instructions (Signed)
Access Code: [email or web address] URL: [email or web address] Date: 12/12/2019 Prepared by: Vista Mink  Exercises Isometric Ankle Inversion at Wall - 1 x daily - 7 x weekly - 1 sets - 50 reps - 5 hold Standing Heel Raise - 10 x daily - 7 x weekly - 1 sets - 5 reps - 3 seconds hold Tandem Stance - 2 x daily - 7 x weekly - 1 sets - 1 reps - 20 hold

## 2019-12-12 NOTE — Therapy (Signed)
[facility] [address] [place], [place], [address] Phone: [phone]   Fax:  [phone]  Physical Therapy Evaluation  Patient Details  Name: Audrey Yoder MRN: [id number] Date of Birth: March 24, 1951 Referring Provider (PT): Frankey Shown   Encounter Date: 12/12/2019   PT End of Session - 12/12/19 1343    Visit Number 1    Number of Visits 8    Date for PT Re-Evaluation 02/06/20    PT Start Time 8144    PT Stop Time 1336    PT Time Calculation (min) 40 min    Activity Tolerance Patient tolerated treatment well    Behavior During Therapy [facility] for tasks assessed/performed           Past Medical History:  Diagnosis Date  . Endometrial ca ([place])   . Hx of colonic polyps 03/31/2016    Past Surgical History:  Procedure Laterality Date  . ABDOMINAL HYSTERECTOMY    . TOTAL VAGINAL HYSTERECTOMY      There were no vitals filed for this visit.    Subjective Assessment - 12/12/19 1256    Subjective L posterior tibial tendonitis.  May 8th had trouble after working out on a treadmill.    Limitations Standing;Walking;Other (comment)   Walking on uneven surfaces.   How long can you sit comfortably? When get up from sitting.    How long can you stand comfortably? ~ 5 minutes    How long can you walk comfortably? ~ 10 minutes    Patient Stated Goals Return to the treadmill without pain and be able to stand and walk for longer periods of time.    Currently in Pain? Yes    Pain Score 7     Pain Location Ankle    Pain Orientation Left;Medial    Pain Descriptors / Indicators Sharp    Pain Type Chronic pain    Pain Onset More than a month ago    Pain Frequency Intermittent    Aggravating Factors  Standing and walking.    Pain Relieving Factors Nothing    Effect of Pain on Daily Activities Keeping her off the treadmill and limits standing and walking endurance.              [facility] PT Assessment - 12/12/19 0001      Assessment   Medical  Diagnosis L posterior tibial tendonitis    Referring Provider (PT) Erlinda Hong, [place]    Onset Date/Surgical Date 10/25/19      Balance Screen   Has the patient fallen in the past 6 months No    Has the patient had a decrease in activity level because of a fear of falling?  No    Is the patient reluctant to leave their home because of a fear of falling?  No      ROM / Strength   AROM / PROM / Strength AROM;Strength      AROM   Overall AROM  Deficits    AROM Assessment Site Ankle    Right/Left Ankle Right;Left    Left Ankle Dorsiflexion 15      Strength   Overall Strength Deficits    Strength Assessment Site Ankle    Right/Left Ankle Right;Left    Right Ankle Inversion 5/5    Right Ankle Eversion 5/5    Left Ankle Inversion 4-/5    Left Ankle Eversion 5/5  Objective measurements completed on examination: See above findings.       [facility] PT Treatment/Exercise - 12/12/19 0001      Neuro Re-ed    Neuro Re-ed Details  Heel to toe balance 2X 20 seconds each (rest in between)      Exercises   Exercises Ankle      Ankle Exercises: Standing   Heel Raises 10 reps;3 seconds   slow eccentrics     Ankle Exercises: Seated   Other Seated Ankle Exercises Ankle Inversion Isometrics 10X 5 seconds                  PT Education - 12/12/19 1342    Education Details Prescribed and reviewd an appropriate HEP.  Discussed foot anatomy and the role of the posterior tibial tendon.    Person(s) Educated Patient    Methods Explanation;Demonstration;Verbal cues;Handout    Comprehension Verbal cues required;Need further instruction;Returned demonstration;Verbalized understanding            PT Short Term Goals - 12/12/19 1439      PT SHORT TERM GOAL #1   Title Audrey Yoder will be independent with her starter HEP at next visit.    Time 2    Period Weeks    Status New    Target Date 12/26/19             PT Long Term Goals - 12/12/19 1533       PT LONG TERM GOAL #1   Title Audrey Yoder will report L ankle pain consistently at 0-2/10 on the Numeric Pain Rating Scale.    Baseline As high as 7/10 on the Numeric Pain Rating Scale.    Time 8    Period Weeks    Status New    Target Date 02/06/20      PT LONG TERM GOAL #2   Title Audrey Yoder will be able to complete her normal walk on her treadmill and do her normal work tasks without being pain limited.    Baseline Treadmill is out and work is slowed by L ankle pain.    Time 8    Period Weeks    Status New    Target Date 02/06/20      PT LONG TERM GOAL #3   Title Improve L ankle strength for inversion and eversion to 5/5 MMT.    Baseline 4- to 4/5 MMT    Time 8    Period Weeks    Status New    Target Date 02/06/20      PT LONG TERM GOAL #4   Title Audrey Yoder will be independent and compliant with her long-term HEP.    Baseline No HEP    Time 8    Period Weeks    Status New    Target Date 02/06/20                  Plan - 12/12/19 1344    Clinical Impression Statement Audrey Yoder has a severe L posterior tibial tendonitis.  It has improved on its own over the past few weeks.  Inversion and plantar flexors weakness is noted.  She will benefit for updating her starter strengthening program to improve standing and walking comfort and endurance given her history of plantar fasciitis.    Personal Factors and Comorbidities Comorbidity 1    Comorbidities Plantar fasciitis    Examination-Participation Restrictions Other   Weight-bearing   Stability/Clinical Decision Making Stable/Uncomplicated    Clinical Decision Making Low  Rehab Potential Good    PT Frequency 1x / week    PT Duration 8 weeks    PT Treatment/Interventions ADLs/Self Care Home Management;Cryotherapy;Therapeutic activities;Therapeutic exercise;Balance training;Neuromuscular re-education;Patient/family education    PT Next Visit Plan Strength progressions for ankle inversion and plantar flexion    PT Home Exercise Plan  See patient instructions.    Consulted and Agree with Plan of Care Patient           Patient will benefit from skilled therapeutic intervention in order to improve the following deficits and impairments:  Abnormal gait, Decreased activity tolerance, Decreased endurance, Decreased strength, Difficulty walking, Increased edema, Pain, Obesity  Visit Diagnosis: Pain in left ankle and joints of left foot  Difficulty walking     Problem List Patient Active Problem List   Diagnosis Date Noted  . Hx of colonic polyps 03/31/2016  . Kidney stones 06/15/2014  . History of vitamin D deficiency 01/13/2012  . Elevated LDL cholesterol level 01/13/2012  . History of uterine cancer 01/13/2012  . Obesity 12/29/2010    Farley Ly PT, MPT 12/12/2019, 3:40 PM  [facility] [address] [place], [place], [address] Phone: [phone]   Fax:  [phone]  Name: Audrey Yoder MRN: [id number] Date of Birth: [birth year]

## 2019-12-17 NOTE — Patient Instructions (Signed)
It was a pleasure to see you today.  Work on diet exercise and weight loss and follow-up in 1 year.

## 2020-01-05 ENCOUNTER — Encounter: Payer: BC Managed Care – PPO | Admitting: Rehabilitative and Restorative Service Providers"

## 2020-02-04 DIAGNOSIS — L821 Other seborrheic keratosis: Secondary | ICD-10-CM | POA: Diagnosis not present

## 2020-02-04 DIAGNOSIS — D485 Neoplasm of uncertain behavior of skin: Secondary | ICD-10-CM | POA: Diagnosis not present

## 2020-02-04 DIAGNOSIS — D2239 Melanocytic nevi of other parts of face: Secondary | ICD-10-CM | POA: Diagnosis not present

## 2020-02-04 DIAGNOSIS — R202 Paresthesia of skin: Secondary | ICD-10-CM | POA: Diagnosis not present

## 2020-02-04 DIAGNOSIS — L282 Other prurigo: Secondary | ICD-10-CM | POA: Diagnosis not present

## 2020-02-04 DIAGNOSIS — D225 Melanocytic nevi of trunk: Secondary | ICD-10-CM | POA: Diagnosis not present

## 2020-02-04 DIAGNOSIS — L578 Other skin changes due to chronic exposure to nonionizing radiation: Secondary | ICD-10-CM | POA: Diagnosis not present

## 2020-02-24 DIAGNOSIS — Z20828 Contact with and (suspected) exposure to other viral communicable diseases: Secondary | ICD-10-CM | POA: Diagnosis not present

## 2020-02-24 DIAGNOSIS — U071 COVID-19: Secondary | ICD-10-CM | POA: Diagnosis not present

## 2020-02-25 DIAGNOSIS — Z20828 Contact with and (suspected) exposure to other viral communicable diseases: Secondary | ICD-10-CM | POA: Diagnosis not present

## 2020-03-31 DIAGNOSIS — Z03818 Encounter for observation for suspected exposure to other biological agents ruled out: Secondary | ICD-10-CM | POA: Diagnosis not present

## 2020-04-02 DIAGNOSIS — Z03818 Encounter for observation for suspected exposure to other biological agents ruled out: Secondary | ICD-10-CM | POA: Diagnosis not present

## 2020-10-12 ENCOUNTER — Encounter: Payer: Self-pay | Admitting: Internal Medicine

## 2020-10-12 DIAGNOSIS — Z1231 Encounter for screening mammogram for malignant neoplasm of breast: Secondary | ICD-10-CM | POA: Diagnosis not present

## 2020-10-12 DIAGNOSIS — Z780 Asymptomatic menopausal state: Secondary | ICD-10-CM | POA: Diagnosis not present

## 2020-10-12 LAB — HM MAMMOGRAPHY

## 2020-10-13 ENCOUNTER — Encounter: Payer: Self-pay | Admitting: Internal Medicine

## 2020-10-14 ENCOUNTER — Encounter: Payer: Self-pay | Admitting: Internal Medicine

## 2020-10-18 ENCOUNTER — Encounter: Payer: Self-pay | Admitting: Internal Medicine

## 2020-11-01 ENCOUNTER — Ambulatory Visit (INDEPENDENT_AMBULATORY_CARE_PROVIDER_SITE_OTHER): Payer: BC Managed Care – PPO | Admitting: Internal Medicine

## 2020-11-01 ENCOUNTER — Encounter: Payer: Self-pay | Admitting: Internal Medicine

## 2020-11-01 ENCOUNTER — Other Ambulatory Visit: Payer: Self-pay

## 2020-11-01 VITALS — BP 140/80 | HR 80 | Temp 98.9°F | Ht 62.5 in | Wt 226.0 lb

## 2020-11-01 DIAGNOSIS — L03011 Cellulitis of right finger: Secondary | ICD-10-CM

## 2020-11-01 DIAGNOSIS — Z23 Encounter for immunization: Secondary | ICD-10-CM

## 2020-11-01 LAB — CBC WITH DIFFERENTIAL/PLATELET
Absolute Monocytes: 694 cells/uL (ref 200–950)
Basophils Absolute: 22 cells/uL (ref 0–200)
Basophils Relative: 0.3 %
Eosinophils Absolute: 102 cells/uL (ref 15–500)
Eosinophils Relative: 1.4 %
HCT: 44.6 % (ref 35.0–45.0)
Hemoglobin: 14.7 g/dL (ref 11.7–15.5)
Lymphs Abs: 1183 cells/uL (ref 850–3900)
MCH: 29.6 pg (ref 27.0–33.0)
MCHC: 33 g/dL (ref 32.0–36.0)
MCV: 89.9 fL (ref 80.0–100.0)
MPV: 9.3 fL (ref 7.5–12.5)
Monocytes Relative: 9.5 %
Neutro Abs: 5300 cells/uL (ref 1500–7800)
Neutrophils Relative %: 72.6 %
Platelets: 272 10*3/uL (ref 140–400)
RBC: 4.96 10*6/uL (ref 3.80–5.10)
RDW: 12.5 % (ref 11.0–15.0)
Total Lymphocyte: 16.2 %
WBC: 7.3 10*3/uL (ref 3.8–10.8)

## 2020-11-01 MED ORDER — DOXYCYCLINE HYCLATE 100 MG PO TABS: 100.0000 mg | ORAL_TABLET | Freq: Two times a day (BID) | ORAL | 0 refills | Status: DC

## 2020-11-01 NOTE — Progress Notes (Signed)
   Subjective:    Patient ID: Audrey Yoder, female    DOB: May 01, 1951, 70 y.o.   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  Patient denies any trauma to the hand.  Does not recall any insect bite over the past couple of days.  She is alarmed because her father once had similar infection that was serious.  Her general health is good.  She had uterine cancer in late 2010 and underwent modified radical hysterectomy with BSO and bilateral lymph node dissection January 2011 at [facility].  Subsequently has been released from follow-up.  Nodes were negative.  Overall her surgery stating was 1 days B.  In 2014 was treated for presumed tickborne illness at an urgent care center in [place].  She had flulike symptoms and reportedly has had a recent tick bite.  History of kidney stones.  Has had 3 COVID-19 immunizations.  Last tetanus immunization was July 2012 and was given update today due to presenting symptoms.   Review of Systems See above no fever chills nausea or vomiting.  No headache.    Objective:   Physical Exam Temperature 98.9 degrees blood pressure 140/80 pulse oximetry 96%  Dorsal aspect of right hand over right first metacarpal there is discoloration and some slight swelling.  Slight tenderness to touch.  Full range of motion in right thumb at both MCP and PIP joints.  CBC with differential drawn and results are pending       Assessment & Plan:  I feel that she has a cellulitis dorsal aspect of the right hand along first metacarpal.  I do not see a puncture or insect bite wound.  Plan: Patient will be treated with doxycycline 100 mg twice daily for 10 days.  CBC with differential drawn and pending.  Advised warm hot compresses for 20 minutes 2 or 3 times daily.  Tetanus update given.  Call if not improved in 24 to 48 hours or sooner if worse.

## 2020-11-01 NOTE — Patient Instructions (Addendum)
Apply warm hot compresses to right dorsum of hand 20 minutes 2 or 3 times daily.  CBC with differential is drawn and results are pending.  Take doxycycline 100 mg twice daily for 10 days.  Tetanus immunization update given.  Call if not better in 24 to 48 hours or sooner if worse.

## 2020-11-25 ENCOUNTER — Other Ambulatory Visit: Payer: Self-pay

## 2020-11-25 ENCOUNTER — Other Ambulatory Visit: Payer: BC Managed Care – PPO | Admitting: Internal Medicine

## 2020-11-25 DIAGNOSIS — Z6841 Body Mass Index (BMI) 40.0 and over, adult: Secondary | ICD-10-CM

## 2020-11-25 DIAGNOSIS — E786 Lipoprotein deficiency: Secondary | ICD-10-CM | POA: Diagnosis not present

## 2020-11-25 DIAGNOSIS — Z0000 Encounter for general adult medical examination without abnormal findings: Secondary | ICD-10-CM | POA: Diagnosis not present

## 2020-11-25 DIAGNOSIS — E7800 Pure hypercholesterolemia, unspecified: Secondary | ICD-10-CM

## 2020-11-26 LAB — COMPLETE METABOLIC PANEL WITH GFR
AG Ratio: 1.6 (calc) (ref 1.0–2.5)
ALT: 17 U/L (ref 6–29)
AST: 14 U/L (ref 10–35)
Albumin: 4.2 g/dL (ref 3.6–5.1)
Alkaline phosphatase (APISO): 97 U/L (ref 37–153)
BUN: 15 mg/dL (ref 7–25)
CO2: 29 mmol/L (ref 20–32)
Calcium: 9.2 mg/dL (ref 8.6–10.4)
Chloride: 104 mmol/L (ref 98–110)
Creat: 0.68 mg/dL (ref 0.60–0.93)
GFR, Est African American: 102 mL/min/{1.73_m2} (ref >=60)
GFR, Est Non African American: 88 mL/min/{1.73_m2} (ref >=60)
Globulin: 2.7 g/dL (calc) (ref 1.9–3.7)
Glucose, Bld: 89 mg/dL (ref 65–99)
Potassium: 4.5 mmol/L (ref 3.5–5.3)
Sodium: 140 mmol/L (ref 135–146)
Total Bilirubin: 0.4 mg/dL (ref 0.2–1.2)
Total Protein: 6.9 g/dL (ref 6.1–8.1)

## 2020-11-26 LAB — CBC WITH DIFFERENTIAL/PLATELET
Absolute Monocytes: 470 cells/uL (ref 200–950)
Basophils Absolute: 22 cells/uL (ref 0–200)
Basophils Relative: 0.4 %
Eosinophils Absolute: 108 cells/uL (ref 15–500)
Eosinophils Relative: 2 %
HCT: 45.1 % — ABNORMAL HIGH (ref 35.0–45.0)
Hemoglobin: 15.3 g/dL (ref 11.7–15.5)
Lymphs Abs: 999 cells/uL (ref 850–3900)
MCH: 30.5 pg (ref 27.0–33.0)
MCHC: 33.9 g/dL (ref 32.0–36.0)
MCV: 89.8 fL (ref 80.0–100.0)
MPV: 9.9 fL (ref 7.5–12.5)
Monocytes Relative: 8.7 %
Neutro Abs: 3802 cells/uL (ref 1500–7800)
Neutrophils Relative %: 70.4 %
Platelets: 270 10*3/uL (ref 140–400)
RBC: 5.02 10*6/uL (ref 3.80–5.10)
RDW: 12.6 % (ref 11.0–15.0)
Total Lymphocyte: 18.5 %
WBC: 5.4 10*3/uL (ref 3.8–10.8)

## 2020-11-26 LAB — LIPID PANEL
Cholesterol: 198 mg/dL (ref <200)
HDL: 47 mg/dL — ABNORMAL LOW (ref >=50)
LDL Cholesterol (Calc): 123 mg/dL (calc) — ABNORMAL HIGH
Non-HDL Cholesterol (Calc): 151 mg/dL (calc) — ABNORMAL HIGH (ref <130)
Total CHOL/HDL Ratio: 4.2 (calc) (ref <5.0)
Triglycerides: 161 mg/dL — ABNORMAL HIGH (ref <150)

## 2020-11-26 LAB — HEMOGLOBIN A1C
Hgb A1c MFr Bld: 5.4 % of total Hgb (ref <5.7)
Mean Plasma Glucose: 108 mg/dL
eAG (mmol/L): 6 mmol/L

## 2020-11-26 LAB — TSH: TSH: 0.86 mIU/L (ref 0.40–4.50)

## 2020-11-30 ENCOUNTER — Encounter: Payer: Self-pay | Admitting: Internal Medicine

## 2020-11-30 ENCOUNTER — Other Ambulatory Visit: Payer: Self-pay

## 2020-11-30 ENCOUNTER — Ambulatory Visit (INDEPENDENT_AMBULATORY_CARE_PROVIDER_SITE_OTHER): Payer: BC Managed Care – PPO | Admitting: Internal Medicine

## 2020-11-30 VITALS — BP 130/78 | HR 88 | Ht 62.5 in | Wt 224.0 lb

## 2020-11-30 DIAGNOSIS — Z86010 Personal history of colonic polyps: Secondary | ICD-10-CM

## 2020-11-30 DIAGNOSIS — Z0000 Encounter for general adult medical examination without abnormal findings: Secondary | ICD-10-CM | POA: Diagnosis not present

## 2020-11-30 DIAGNOSIS — Z6841 Body Mass Index (BMI) 40.0 and over, adult: Secondary | ICD-10-CM

## 2020-11-30 DIAGNOSIS — E782 Mixed hyperlipidemia: Secondary | ICD-10-CM | POA: Diagnosis not present

## 2020-11-30 DIAGNOSIS — E786 Lipoprotein deficiency: Secondary | ICD-10-CM | POA: Diagnosis not present

## 2020-11-30 LAB — POCT URINALYSIS DIPSTICK
Appearance: NEGATIVE
Bilirubin, UA: NEGATIVE
Blood, UA: NEGATIVE
Glucose, UA: NEGATIVE
Ketones, UA: NEGATIVE
Leukocytes, UA: NEGATIVE
Nitrite, UA: NEGATIVE
Odor: NEGATIVE
Protein, UA: NEGATIVE
Spec Grav, UA: 1.01 (ref 1.010–1.025)
Urobilinogen, UA: 0.2 E.U./dL
pH, UA: 6.5 (ref 5.0–8.0)

## 2020-11-30 NOTE — Patient Instructions (Addendum)
Patient to consider colonoscopy and COVID booster.  Form for insurance company completed.  It was a pleasure to see you today.  Work on diet and exercise.  Return in 1 year or as needed.  Lipids are elevated-both triglycerides and LDL cholesterol.  Have suggested [facility] as an option

## 2020-11-30 NOTE — Progress Notes (Addendum)
Subjective:    Patient ID: Audrey Yoder, female    DOB: 1951-03-13, 70 y.o.   MRN: [id number]  HPI  70 year old Female for health maintenance exam and evaluation of medical issues.  Bone density and mammogram up to date. T score is WNL for age.  Colonoscopy past due since 2020.  Had colonoscopy 2017 with Dr. Carlean Purl.  Had 1 sessile serrated adenoma removed  History of uterine cancer in 2010 and underwent modified radical hysterectomy with BSO and bilateral lymph node dissection January 2011 [facility].  She had negative nodes.  She had vaginal cuff brachytherapy for 3 doses 1 month apart as adjuvant therapy.  Has had no further issues and is released from follow-up.  History of left knee osteoarthritis 2008 treated with steroid injection by orthopedist.  In 2014 she was treated for presumed tickborne illness at an urgent care center in [place].  She reported flulike symptoms at the time after having a recent tick bite.  History of kidney stones.  Has not seen urologist.  Usually does not seek medical care when she has an attack but basically rides a nail.  She has well water.  Last serious attack was 2019.  She has not had CT of abdomen and pelvis since 2016.  She did have a kidney stone in 2015 and had stone analysis through this office.  Stone was predominantly uric acid with 20% calcium oxalate.  She subsequently had uric acid level drawn and it was high normal at 6.9 but she is not on gout medication and has never had gout.  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  Does not smoke.  Occasional wine consumption.  She has 2 adult sons and grandchildren.  She is married.  Family history: Father with history of myeloproliferative disorder.  Mother with history of stroke.  1 brother with history of hemochromatosis.  She has been screened for hemochromatosis in 2020 and her ferritin level was normal at 71.  Review of Systems left ankle issues that have  been persistent. Cannot exercise well. Can't do treadmill. Dr. Sherrian Divers sent her to PT.     Objective:   Physical Exam Blood pressure 130/78 pulse 88 pulse oximetry 96% weight 224 pounds BMI 40.32  Skin: Warm and dry.  No cervical adenopathy.  TMs clear.  Chest is clear.  Breasts are without masses.  Cardiac exam: Regular rate and rhythm without murmurs or gallops.  Abdomen soft nondistended without hepatosplenomegaly masses or tenderness.  GYN exam deferred  No lower extremity pitting edema.  Neuro is intact without focal deficits.  Affect thought and judgment are normal.       Assessment & Plan:   BMI 40.3 today-encourage diet, exercise and weight loss.  Have suggested COVID healthy weight clinic.  History of kidney stones by history but has not seen urologist and no recent episodes  Family history of hemochromatosis but her ferritin level was normal when checked a couple of years ago.  Mixed hyperlipidemia-triglycerides elevated at 161 and LDL cholesterol elevated at 123.  HDL is slightly low at 47.  Total cholesterol normal at 198.  History of adenomatous colon polyp and needs to follow-up with Dr. Carlean Purl with repeat colonoscopy  Plan: Return in 1 year or as needed.  See above recommendations.  Needs colonoscopy.  Recommend COVID booster.  Tetanus immunization update given in May.  Mammogram done in April was normal.  Bone density study in April was also normal.

## 2021-02-10 DIAGNOSIS — L570 Actinic keratosis: Secondary | ICD-10-CM | POA: Diagnosis not present

## 2021-02-10 DIAGNOSIS — D225 Melanocytic nevi of trunk: Secondary | ICD-10-CM | POA: Diagnosis not present

## 2021-02-10 DIAGNOSIS — L720 Epidermal cyst: Secondary | ICD-10-CM | POA: Diagnosis not present

## 2021-02-10 DIAGNOSIS — L821 Other seborrheic keratosis: Secondary | ICD-10-CM | POA: Diagnosis not present

## 2021-02-10 DIAGNOSIS — L578 Other skin changes due to chronic exposure to nonionizing radiation: Secondary | ICD-10-CM | POA: Diagnosis not present

## 2021-07-05 ENCOUNTER — Ambulatory Visit: Payer: BC Managed Care – PPO | Admitting: Internal Medicine

## 2021-07-05 ENCOUNTER — Encounter: Payer: Self-pay | Admitting: Internal Medicine

## 2021-07-05 ENCOUNTER — Telehealth: Payer: Self-pay

## 2021-07-05 ENCOUNTER — Other Ambulatory Visit: Payer: Self-pay

## 2021-07-05 VITALS — BP 148/80 | HR 74 | Temp 98.7°F

## 2021-07-05 DIAGNOSIS — J0100 Acute maxillary sinusitis, unspecified: Secondary | ICD-10-CM | POA: Diagnosis not present

## 2021-07-05 DIAGNOSIS — H1132 Conjunctival hemorrhage, left eye: Secondary | ICD-10-CM

## 2021-07-05 DIAGNOSIS — J3489 Other specified disorders of nose and nasal sinuses: Secondary | ICD-10-CM | POA: Diagnosis not present

## 2021-07-05 DIAGNOSIS — H6503 Acute serous otitis media, bilateral: Secondary | ICD-10-CM | POA: Diagnosis not present

## 2021-07-05 MED ORDER — METHYLPREDNISOLONE ACETATE 80 MG/ML IJ SUSP
80.0000 mg | Freq: Once | INTRAMUSCULAR | Status: AC
  Administered 2021-07-05: 80 mg via INTRAMUSCULAR

## 2021-07-05 MED ORDER — AZITHROMYCIN 250 MG PO TABS: ORAL_TABLET | ORAL | 0 refills | Status: AC

## 2021-07-05 NOTE — Patient Instructions (Signed)
Referral placed for colonoscopy.  Depo-Medrol 80 mg IM given for respiratory congestion.  Patient will be treated with Zithromax Z-PAK 2 tabs day 1 followed by 1 tab days 2 through 5 orally.  Call if not better in 7 to 10 days or sooner if worse.  Scleral hemorrhage will resolve on its own in a few days.

## 2021-07-05 NOTE — Telephone Encounter (Signed)
scheduled

## 2021-07-05 NOTE — Telephone Encounter (Signed)
COVID test neg

## 2021-07-05 NOTE — Telephone Encounter (Signed)
Patient states that she thinks she has a sinus infection. Severe pressure around eyes, burst blood vessel in one eye. Pressure in ears. She had a fever cough and congestion around christmas but only has a slight cough now. She has been advised to take covid test. No headache, no fever, no sore throat.

## 2021-07-05 NOTE — Progress Notes (Signed)
° °  Subjective:    Patient ID: Audrey Yoder, female    DOB: Apr 08, 1951, 71 y.o.   MRN: [id number]  HPI 71 year old Female with remote history of uterine cancer and  history of mixed hyperlipidemia not on statin medication has acute maxillary sinus pressure, ear pressure and scleral hemorrhage in left eye. No fever, headache or sore throat. Records indicate 3 Covid vaccines in 2021. None listed for 2022.  Patient is due for colonoscopy at [facility] Says she prefers Female for this procedure. Referral placed.  Review of Systems see above- denies significant cough or lower respiratory congestion. No fever, chills or dysguesia     Objective:   Physical Exam  Scleral hemorrhage left eye. No discharge from eye.  Both TMs slight full and pink. Pharynx is clear. Neck supple Chest is clear without rales or wheezing.      Assessment & Plan:  Scleral hemorrhage left eye. These  are fairly common and likely not related to respiratory symptoms. Can be seen with taking ASA, steroids, NSAIDS, or trauma to eye or can be spontaneous without provocation. Reassured this would clear up in a few days.  Acute  bilateral serous otitis media  Acute maxillary sinusitis  Need for colonoscopy- referral placed  Plan:Zithromax Z pak 2 tabs day 1 followed by one tab days 2-5.  Depo-Medrol 80 mg IM for respiratory congestion.  Rest and drink fluids.  Recommend COVID booster after recovering from this illness.  Colonoscopy referral placed with [facility]

## 2021-07-20 NOTE — Telephone Encounter (Signed)
Scheduled for Friday, not available on Thursday

## 2021-07-20 NOTE — Telephone Encounter (Signed)
Audrey Yoder called to say she has finished her medication, however she is still having pressure in her left ear and aching. She would like to come in and see you.

## 2021-07-22 ENCOUNTER — Other Ambulatory Visit: Payer: Self-pay

## 2021-07-22 ENCOUNTER — Ambulatory Visit: Payer: BC Managed Care – PPO | Admitting: Internal Medicine

## 2021-07-22 ENCOUNTER — Encounter: Payer: Self-pay | Admitting: Internal Medicine

## 2021-07-22 VITALS — BP 122/80 | HR 79 | Temp 98.7°F

## 2021-07-22 DIAGNOSIS — J0101 Acute recurrent maxillary sinusitis: Secondary | ICD-10-CM | POA: Diagnosis not present

## 2021-07-22 MED ORDER — METHYLPREDNISOLONE 4 MG PO TBPK: ORAL_TABLET | ORAL | 0 refills | Status: DC

## 2021-07-22 MED ORDER — DOXYCYCLINE HYCLATE 100 MG PO TABS: 100.0000 mg | ORAL_TABLET | Freq: Two times a day (BID) | ORAL | 0 refills | Status: DC

## 2021-07-22 NOTE — Progress Notes (Signed)
° °  Subjective:    Patient ID: Audrey Yoder, female    DOB: 1951-06-13, 71 y.o.   MRN: [id number]  HPI 71 year old Female back with recurrent maxillary sinus pressure.  She was seen here January 17 with acute maxillary sinus pressure, ear pressure and scleral hemorrhage in left eye.  Was felt at that time to have acute maxillary sinusitis and bilateral serous otitis media.  Was treated with Zithromax Z-PAK and IM Depo-Medrol.  Improved for a few days but has symptoms once again.    Review of Systems no fever or shaking chills but has malaise and fatigue and feels maxillary sinus pressure and some ear discomfort     Objective:   Physical Exam Blood pressure 122/80 pulse 79 temperature 98.7 pulse oximetry 95%.  Pharynx is clear.  TMs are not red.  Neck is supple.  Chest clear.       Assessment & Plan:  Recurrent or perhaps persistent maxillary sinusitis  Plan: Doxycycline 100 mg twice daily for 10 days.  Patient will take Medrol 4 mg tablets in tapering course starting with 6 tablets day 1 and decreasing by 1 tablet daily i.e. 6-5-4-3-2-1.

## 2021-08-07 NOTE — Patient Instructions (Signed)
Take doxycycline 100 mg twice daily for 10 days and take Medrol in tapering course starting with 6 tablets day 1 and decreasing by 1 tablet daily.

## 2021-10-18 DIAGNOSIS — Z1231 Encounter for screening mammogram for malignant neoplasm of breast: Secondary | ICD-10-CM | POA: Diagnosis not present

## 2021-10-21 DIAGNOSIS — R928 Other abnormal and inconclusive findings on diagnostic imaging of breast: Secondary | ICD-10-CM | POA: Diagnosis not present

## 2021-10-21 DIAGNOSIS — N6001 Solitary cyst of right breast: Secondary | ICD-10-CM | POA: Diagnosis not present

## 2021-11-28 ENCOUNTER — Other Ambulatory Visit: Payer: BC Managed Care – PPO

## 2021-11-28 DIAGNOSIS — Z6841 Body Mass Index (BMI) 40.0 and over, adult: Secondary | ICD-10-CM | POA: Diagnosis not present

## 2021-11-28 DIAGNOSIS — E782 Mixed hyperlipidemia: Secondary | ICD-10-CM

## 2021-11-28 DIAGNOSIS — R5383 Other fatigue: Secondary | ICD-10-CM | POA: Diagnosis not present

## 2021-11-28 DIAGNOSIS — Z8639 Personal history of other endocrine, nutritional and metabolic disease: Secondary | ICD-10-CM | POA: Diagnosis not present

## 2021-11-28 DIAGNOSIS — E669 Obesity, unspecified: Secondary | ICD-10-CM

## 2021-11-29 LAB — COMPLETE METABOLIC PANEL WITH GFR
AG Ratio: 1.6 (calc) (ref 1.0–2.5)
ALT: 14 U/L (ref 6–29)
AST: 13 U/L (ref 10–35)
Albumin: 4 g/dL (ref 3.6–5.1)
Alkaline phosphatase (APISO): 95 U/L (ref 37–153)
BUN: 17 mg/dL (ref 7–25)
CO2: 30 mmol/L (ref 20–32)
Calcium: 9.1 mg/dL (ref 8.6–10.4)
Chloride: 106 mmol/L (ref 98–110)
Creat: 0.74 mg/dL (ref 0.60–1.00)
Globulin: 2.5 g/dL (calc) (ref 1.9–3.7)
Glucose, Bld: 88 mg/dL (ref 65–99)
Potassium: 5 mmol/L (ref 3.5–5.3)
Sodium: 142 mmol/L (ref 135–146)
Total Bilirubin: 0.5 mg/dL (ref 0.2–1.2)
Total Protein: 6.5 g/dL (ref 6.1–8.1)
eGFR: 86 mL/min/{1.73_m2} (ref >=60)

## 2021-11-29 LAB — CBC WITH DIFFERENTIAL/PLATELET
Absolute Monocytes: 539 cells/uL (ref 200–950)
Basophils Absolute: 28 cells/uL (ref 0–200)
Basophils Relative: 0.5 %
Eosinophils Absolute: 110 cells/uL (ref 15–500)
Eosinophils Relative: 2 %
HCT: 42.7 % (ref 35.0–45.0)
Hemoglobin: 14.6 g/dL (ref 11.7–15.5)
Lymphs Abs: 1073 cells/uL (ref 850–3900)
MCH: 31.5 pg (ref 27.0–33.0)
MCHC: 34.2 g/dL (ref 32.0–36.0)
MCV: 92.2 fL (ref 80.0–100.0)
MPV: 9.9 fL (ref 7.5–12.5)
Monocytes Relative: 9.8 %
Neutro Abs: 3751 cells/uL (ref 1500–7800)
Neutrophils Relative %: 68.2 %
Platelets: 253 10*3/uL (ref 140–400)
RBC: 4.63 10*6/uL (ref 3.80–5.10)
RDW: 12.4 % (ref 11.0–15.0)
Total Lymphocyte: 19.5 %
WBC: 5.5 10*3/uL (ref 3.8–10.8)

## 2021-11-29 LAB — LIPID PANEL
Cholesterol: 159 mg/dL (ref <200)
HDL: 46 mg/dL — ABNORMAL LOW (ref >=50)
LDL Cholesterol (Calc): 94 mg/dL (calc)
Non-HDL Cholesterol (Calc): 113 mg/dL (calc) (ref <130)
Total CHOL/HDL Ratio: 3.5 (calc) (ref <5.0)
Triglycerides: 92 mg/dL (ref <150)

## 2021-11-29 LAB — VITAMIN D 25 HYDROXY (VIT D DEFICIENCY, FRACTURES): Vit D, 25-Hydroxy: 32 ng/mL (ref 30–100)

## 2021-11-29 LAB — HEMOGLOBIN A1C
Hgb A1c MFr Bld: 5.3 % of total Hgb (ref <5.7)
Mean Plasma Glucose: 105 mg/dL
eAG (mmol/L): 5.8 mmol/L

## 2021-11-29 LAB — TSH: TSH: 0.5 mIU/L (ref 0.40–4.50)

## 2021-12-01 ENCOUNTER — Ambulatory Visit (INDEPENDENT_AMBULATORY_CARE_PROVIDER_SITE_OTHER): Payer: BC Managed Care – PPO | Admitting: Internal Medicine

## 2021-12-01 ENCOUNTER — Encounter: Payer: Self-pay | Admitting: Internal Medicine

## 2021-12-01 VITALS — BP 122/64 | HR 92 | Temp 98.2°F | Ht 62.0 in | Wt 227.0 lb

## 2021-12-01 DIAGNOSIS — Z87442 Personal history of urinary calculi: Secondary | ICD-10-CM | POA: Diagnosis not present

## 2021-12-01 DIAGNOSIS — R319 Hematuria, unspecified: Secondary | ICD-10-CM | POA: Diagnosis not present

## 2021-12-01 DIAGNOSIS — E786 Lipoprotein deficiency: Secondary | ICD-10-CM | POA: Diagnosis not present

## 2021-12-01 DIAGNOSIS — E7849 Other hyperlipidemia: Secondary | ICD-10-CM

## 2021-12-01 DIAGNOSIS — Z6841 Body Mass Index (BMI) 40.0 and over, adult: Secondary | ICD-10-CM

## 2021-12-01 DIAGNOSIS — Z8349 Family history of other endocrine, nutritional and metabolic diseases: Secondary | ICD-10-CM

## 2021-12-01 DIAGNOSIS — Z0000 Encounter for general adult medical examination without abnormal findings: Secondary | ICD-10-CM

## 2021-12-01 DIAGNOSIS — Z8542 Personal history of malignant neoplasm of other parts of uterus: Secondary | ICD-10-CM

## 2021-12-01 DIAGNOSIS — M1712 Unilateral primary osteoarthritis, left knee: Secondary | ICD-10-CM

## 2021-12-01 DIAGNOSIS — N200 Calculus of kidney: Secondary | ICD-10-CM

## 2021-12-01 DIAGNOSIS — M722 Plantar fascial fibromatosis: Secondary | ICD-10-CM

## 2021-12-01 LAB — POCT URINALYSIS DIPSTICK
Bilirubin, UA: NEGATIVE
Glucose, UA: NEGATIVE
Ketones, UA: NEGATIVE
Leukocytes, UA: NEGATIVE
Nitrite, UA: NEGATIVE
Protein, UA: NEGATIVE
Spec Grav, UA: 1.025 (ref 1.010–1.025)
Urobilinogen, UA: 0.2 E.U./dL
pH, UA: 5 (ref 5.0–8.0)

## 2021-12-01 NOTE — Patient Instructions (Addendum)
It was a pleasure to see you today.  Continue to work on diet exercise and weight reduction.  Please call Dr. Carlean Purl about repeat colonoscopy.  Vaccines discussed.  Return in 1 year or as needed.  Mammogram is up-to-date.

## 2021-12-01 NOTE — Progress Notes (Signed)
   Subjective:    Patient ID: Audrey Yoder, female    DOB: 09/06/1950, 71 y.o.   MRN: [id number]  HPI  71 year old Female seen for health maintenance exam and evaluation of medical issues.  Last colonoscopy was in 2017 and she was reminded about this today.  Had 1 sessile serrated adenoma removed.  History of uterine cancer in 2010 and underwent modified radical hysterectomy with BSO and bilateral lymph node dissection January 2011 at [facility].  She had negative nodes.  She had vaginal cuff brachytherapy for 3 doses 1 month apart as adjuvant therapy.  Has had no further issues and has been released from follow-up.  History of left knee osteoarthritis 2008 treated with steroid injection by orthopedist.  In 2014 she was treated for presumed tickborne illness at an urgent care center in [place].  She reported flulike symptoms at the time after having a recent tick bite.  History of kidney stones.  Has not seen urologist.  Usually does not seek medical care when she has an attack.  She has well water.  Last serious attack was in 2019.  She has not had CT of abdomen and pelvis since 2016.  She did have a kidney stone in 2015 and had stone analysis through this office.  Stone was predominantly uric acid with 20% calcium oxalate.  She subsequently had uric acid level drawn and it was high normal at 6.9 but she is not on gout medication and has never had gout.  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  Does not smoke.  Occasional wine consumption.  She has 2 adult sons and grandchildren.  She is married.  Family history: Father with history of myeloproliferative disorder.  Mother with history of stroke.  1 brother with history of hemochromatosis.  She has been screened for hemochromatosis in 2020 and her ferritin level was normal at 71.    Review of Systems has had plantar fasciitis but walks some for exercise.  History of left ankle issues  persistent.     Objective:   Physical Exam  BP 122/64 BMI 41.52 weight 227 pounds pulse 92 regular pulse oximetry 96% on room air  Skin: Warm and dry.  Nodes none.  TMs clear.  Pharynx is clear.  Chest is clear.  Cardiac exam: Regular rate and rhythm without murmurs or gallops.  Abdomen is soft nondistended without hepatosplenomegaly, masses, or tenderness.  GYN exam is deferred.  No pitting edema of the lower extremities.  Neurological exam is intact without gross focal deficits.  Affect, thought, and judgment are normal.     Assessment & Plan:   She has low HDL of 46  Her LDL is normal at 94 and a year ago was 123.  Her triglycerides a year ago were 161 and her female 19.  Her total cholesterol has decreased from 1 98-1 59.  She attributes this to watching her diet and walking.  Family history of hemochromatosis but her ferritin level was normal when checked in the past.  History of adenomatous colon polyp and needs follow-up with Dr. Carlean Purl  Tetanus immunization is up-to-date.  Should consider Prevnar 20 vaccine and Shingrix series.

## 2021-12-02 LAB — URINE CULTURE
MICRO NUMBER:: 13530129
SPECIMEN QUALITY:: ADEQUATE

## 2022-01-30 ENCOUNTER — Ambulatory Visit (HOSPITAL_COMMUNITY)
Admission: RE | Admit: 2022-01-30 | Discharge: 2022-01-30 | Disposition: A | Discharge status: Home / Self Care | Payer: BC Managed Care – PPO | Source: Ambulatory Visit | Attending: Internal Medicine | Admitting: Internal Medicine

## 2022-01-30 DIAGNOSIS — E7849 Other hyperlipidemia: Secondary | ICD-10-CM | POA: Insufficient documentation

## 2022-02-03 ENCOUNTER — Other Ambulatory Visit: Payer: Self-pay

## 2022-02-03 DIAGNOSIS — E782 Mixed hyperlipidemia: Secondary | ICD-10-CM

## 2022-02-16 DIAGNOSIS — D2239 Melanocytic nevi of other parts of face: Secondary | ICD-10-CM | POA: Diagnosis not present

## 2022-02-16 DIAGNOSIS — D225 Melanocytic nevi of trunk: Secondary | ICD-10-CM | POA: Diagnosis not present

## 2022-02-16 DIAGNOSIS — L570 Actinic keratosis: Secondary | ICD-10-CM | POA: Diagnosis not present

## 2022-02-16 DIAGNOSIS — L820 Inflamed seborrheic keratosis: Secondary | ICD-10-CM | POA: Diagnosis not present

## 2022-02-16 DIAGNOSIS — L821 Other seborrheic keratosis: Secondary | ICD-10-CM | POA: Diagnosis not present

## 2022-03-14 ENCOUNTER — Encounter (HOSPITAL_BASED_OUTPATIENT_CLINIC_OR_DEPARTMENT_OTHER): Payer: Self-pay | Admitting: Cardiovascular Disease

## 2022-03-14 ENCOUNTER — Ambulatory Visit (HOSPITAL_BASED_OUTPATIENT_CLINIC_OR_DEPARTMENT_OTHER): Payer: BC Managed Care – PPO | Admitting: Cardiovascular Disease

## 2022-03-14 DIAGNOSIS — R030 Elevated blood-pressure reading, without diagnosis of hypertension: Secondary | ICD-10-CM | POA: Insufficient documentation

## 2022-03-14 DIAGNOSIS — E7800 Pure hypercholesterolemia, unspecified: Secondary | ICD-10-CM

## 2022-03-14 DIAGNOSIS — I2510 Atherosclerotic heart disease of native coronary artery without angina pectoris: Secondary | ICD-10-CM

## 2022-03-14 DIAGNOSIS — Z6841 Body Mass Index (BMI) 40.0 and over, adult: Secondary | ICD-10-CM

## 2022-03-14 HISTORY — DX: Elevated blood-pressure reading, without diagnosis of hypertension: R03.0

## 2022-03-14 HISTORY — DX: Atherosclerotic heart disease of native coronary artery without angina pectoris: I25.10

## 2022-03-14 MED ORDER — ROSUVASTATIN CALCIUM 5 MG PO TABS: 5.0000 mg | ORAL_TABLET | Freq: Every day | ORAL | 3 refills | Status: DC

## 2022-03-14 NOTE — Assessment & Plan Note (Signed)
Blood pressures elevated in the office but has generally not been an issue.  She notes that she does have some whitecoat hypertension and has been under stress lately.  Recommend that she check her blood pressures at home to make sure that they are less than 130/80.  Call us if they are consistently running higher than that.

## 2022-03-14 NOTE — Patient Instructions (Signed)
Medication Instructions:  START ENTERIC COATED ASPIRIN 81 MG DAILY   START ROSUVASTATIN 5 MG DAILY   *If you need a refill on your cardiac medications before your next appointment, please call your pharmacy*  Lab Work: FASTING LP/CMET IN 2 MONTHS   If you have labs (blood work) drawn today and your tests are completely normal, you will receive your results only by: [place] (if you have MyChart) OR A paper copy in the mail If you have any lab test that is abnormal or we need to change your treatment, we will call you to review the results.  Testing/Procedures: NONE  Follow-Up: At [facility], you and your health needs are our priority.  As part of our continuing mission to provide you with exceptional heart care, we have created designated Provider Care Teams.  These Care Teams include your primary Cardiologist (physician) and Advanced Practice Providers (APPs -  Physician Assistants and Nurse Practitioners) who all work together to provide you with the care you need, when you need it.  We recommend signing up for the patient portal called "MyChart".  Sign up information is provided on this After Visit Summary.  MyChart is used to connect with patients for Virtual Visits (Telemedicine).  Patients are able to view lab/test results, encounter notes, upcoming appointments, etc.  Non-urgent messages can be sent to your provider as well.   To learn more about what you can do with MyChart, go to [email or web address].    Your next appointment:   6 month(s)  The format for your next appointment:   Virtual Visit   Provider:   Skeet Latch, MD    Other Instructions CHECK YOUR BLOOD PRESSURE AT HOME, IF IT IS STILL HIGH CALL THE OFFICE TOMORROW BETWEEN 8:30-4:30

## 2022-03-14 NOTE — Assessment & Plan Note (Signed)
Given that she has some coronary calcification, her LDL goal is less than 70.  Recommend trying to increase her exercise to at least 150 minutes weekly.  Given that she cannot tolerate the treadmill this long consider recumbent bike or water exercises.  She is willing to start rosuvastatin 5 mg daily.  Repeat fasting lipids and a CMP in 2 months.

## 2022-03-14 NOTE — Assessment & Plan Note (Signed)
Recommend increasing exercise as above.  She identified limiting portions as a target for dietary improvement.

## 2022-03-14 NOTE — Progress Notes (Signed)
Cardiology Office Note:    Date:  03/15/2022   ID:  Audrey Yoder, DOB [birth year], MRN [id number]  PCP:  Elby Showers, MD   [facility] Providers Cardiologist:  None     Referring MD: Elby Showers, MD   No chief complaint on file.   History of Present Illness:    Audrey Yoder is a 71 y.o. female with a hx of hyperlipidemia, nonobstructive CAD, obesity, and uterine cancer s/p hysterectomy and lymph node dissection, who is being seen today for the evaluation of hyperlipidemia at the request of Elby Showers, MD. She last saw her PCP and her LDL had improved from 123 to 94.  She attributes this to increased exercise.  She had a calcium score 01/2022 which revealed a score of 308, which was 84th percentile for age and gender.  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 In clinic today her blood pressure is 148/86 (142/68 on recheck) which she attributes to white coat hypertension. Once in a while she checks her blood pressure at home if she is feeling symptoms, and generally it is well-controlled.  She reports occasional LE edema in her ankles which usually improves by the next morning. For exercise, she tries to use the treadmill for 10-15 minutes once a day.  She does not like water exercise and is unable to use a traditional exercise bike due to limited range of motion in her knee.  However she is limited by pain in the tendons in her legs. She was previously walking 20 minutes a day in PT before she had a severe flare-up and since then she has had 2 limit her exercise. Regarding her diet she normally prepares meals at home and avoids ordering out. She prefers chicken over red meat. While cooking she usually does not add much salt, if any. She notes that her main issue is portion control rather than the quality of her food.  She drinks wine occasionally.  She denies any palpitations, chest pain, shortness of breath. No  lightheadedness, headaches, syncope, orthopnea, or PND.   Past Medical History:  Diagnosis Date   CAD in native artery 03/14/2022   Elevated blood pressure reading 03/14/2022   Endometrial ca [facility])    Hx of colonic polyps 03/31/2016    Past Surgical History:  Procedure Laterality Date   ABDOMINAL HYSTERECTOMY     TOTAL VAGINAL HYSTERECTOMY      Current Medications: Current Meds  Medication Sig   aspirin EC 81 MG tablet Take 81 mg by mouth daily. Swallow whole.   cholecalciferol (VITAMIN D3) 25 MCG (1000 UNIT) tablet Take 1,000 Units by mouth daily.   Multiple Vitamin (MULTIVITAMIN) tablet Take 1 tablet by mouth daily.   rosuvastatin (CRESTOR) 5 MG tablet Take 1 tablet (5 mg total) by mouth daily.     Allergies:   Sulfa antibiotics and Levofloxacin   Social History   Socioeconomic History   Marital status: Married    Spouse name: Not on file   Number of children: Not on file   Years of education: Not on file   Highest education level: Not on file  Occupational History   Not on file  Tobacco Use   Smoking status: Never   Smokeless tobacco: Never  Substance and Sexual Activity   Alcohol use: Yes    Alcohol/week: 2.0 standard drinks of alcohol    Types: 2 Glasses of wine per week  Drug use: No   Sexual activity: Not on file  Other Topics Concern   Not on file  Social History Narrative   Married, is an elder Sports coach and estate attorney   Social Determinants of Health   Financial Resource Strain: Not on file  Food Insecurity: No Food Insecurity (03/14/2022)   Hunger Vital Sign    Worried About Running Out of Food in the Last Year: Never true    Ran Out of Food in the Last Year: Never true  Transportation Needs: Not on file  Physical Activity: Insufficiently Active (03/14/2022)   Exercise Vital Sign    Days of Exercise per Week: 7 days    Minutes of Exercise per Session: 20 min  Stress: Not on file  Social Connections: Not on file     Family History: The  patient's family history includes COPD in her paternal grandfather; Stroke in her maternal uncle; Stroke (age of onset: 76) in her mother.  ROS:   Please see the history of present illness.    (+) Bilateral ankle edema (+) Bilateral LE pain (+) Easy bruising All other systems reviewed and are negative.  EKGs/Labs/Other Studies Reviewed:    The following studies were reviewed today:  CT Calcium Score  01/30/2022: FINDINGS: Coronary arteries: Normal origins.   Coronary Calcium Score:   Left main: 0   Left anterior descending artery: 187   Left circumflex artery: 62.2   Right coronary artery: 58.9   Total: 308   Percentile: 84th   Pericardium: Normal.   Ascending Aorta: Normal caliber.  3.2 cm.   Non-cardiac: See separate report from [facility].   IMPRESSION: Coronary calcium score of 308. This was 84th percentile for age-, race-, and sex-matched controls.   EKG:  EKG is personally reviewed. 03/14/2022: Sinus rhythm. Rate 79 bpm. Low voltage.  Recent Labs: 11/28/2021: ALT 14; BUN 17; Creat 0.74; Hemoglobin 14.6; Platelets 253; Potassium 5.0; Sodium 142; TSH 0.50   Recent Lipid Panel    Component Value Date/Time   CHOL 159 11/28/2021 0910   TRIG 92 11/28/2021 0910   HDL 46 (L) 11/28/2021 0910   CHOLHDL 3.5 11/28/2021 0910   VLDL 18 11/14/2016 1030   LDLCALC 94 11/28/2021 0910     Risk Assessment/Calculations:           Physical Exam:    Wt Readings from Last 3 Encounters:  03/14/22 225 lb (102.1 kg)  12/01/21 227 lb (103 kg)  11/30/20 224 lb (101.6 kg)     VS:  BP (!) 142/68 (BP Location: Left Arm, Patient Position: Sitting, Cuff Size: Large)   Pulse 79   Ht '5\' 2"'$  (1.575 m)   Wt 225 lb (102.1 kg)   BMI 41.15 kg/m  , BMI Body mass index is 41.15 kg/m. GENERAL:  Well appearing HEENT: Pupils equal round and reactive, fundi not visualized, oral mucosa unremarkable NECK:  No jugular venous distention, waveform within normal limits,  carotid upstroke brisk and symmetric, no bruits, no thyromegaly LUNGS:  Clear to auscultation bilaterally HEART:  RRR.  PMI not displaced or sustained,S1 and S2 within normal limits, no S3, no S4, no clicks, no rubs, no murmurs EXT:  2 plus pulses throughout, no edema, no cyanosis no clubbing SKIN:  No rashes no nodules NEURO:  Cranial nerves II through XII grossly intact, motor grossly intact throughout PSYCH:  Cognitively intact, oriented to person place and time   ASSESSMENT:    1. Elevated LDL cholesterol level   2. Elevated  blood pressure reading   3. Class 3 severe obesity with body mass index (BMI) of 40.0 to 44.9 in adult, unspecified obesity type, unspecified whether serious comorbidity present ([place])   4. CAD in native artery    PLAN:    Elevated LDL cholesterol level Given that she has some coronary calcification, her LDL goal is less than 70.  Recommend trying to increase her exercise to at least 150 minutes weekly.  Given that she cannot tolerate the treadmill this long consider recumbent bike or water exercises.  She is willing to start rosuvastatin 5 mg daily.  Repeat fasting lipids and a CMP in 2 months.  Elevated blood pressure reading Blood pressures elevated in the office but has generally not been an issue.  She notes that she does have some whitecoat hypertension and has been under stress lately.  Recommend that she check her blood pressures at home to make sure that they are less than 130/80.  Call us if they are consistently running higher than that.  Obesity Recommend increasing exercise as above.  She identified limiting portions as a target for dietary improvement.  CAD in native artery Calcium score was 84th percentile.  Continue focusing on diet and exercise as above.  Add aspirin 81 mg daily and rosuvastatin 5 mg daily.  Check lipids and a CMP in 2 months.  LDL goal is less than 70.        Disposition: FU with Vadie Principato C. Oval Linsey, MD, [facility] in 6 months  with telemedicine visit.  Medication Adjustments/Labs and Tests Ordered: Current medicines are reviewed at length with the patient today.  Concerns regarding medicines are outlined above.   Orders Placed This Encounter  Procedures   Lipid panel   Comprehensive metabolic panel   EKG 76-BHAL   Meds ordered this encounter  Medications   rosuvastatin (CRESTOR) 5 MG tablet    Sig: Take 1 tablet (5 mg total) by mouth daily.    Dispense:  90 tablet    Refill:  3   Patient Instructions  Medication Instructions:  START ENTERIC COATED ASPIRIN 81 MG DAILY   START ROSUVASTATIN 5 MG DAILY   *If you need a refill on your cardiac medications before your next appointment, please call your pharmacy*  Lab Work: FASTING LP/CMET IN 2 MONTHS   If you have labs (blood work) drawn today and your tests are completely normal, you will receive your results only by: [place] (if you have MyChart) OR A paper copy in the mail If you have any lab test that is abnormal or we need to change your treatment, we will call you to review the results.  Testing/Procedures: NONE  Follow-Up: At [facility], you and your health needs are our priority.  As part of our continuing mission to provide you with exceptional heart care, we have created designated Provider Care Teams.  These Care Teams include your primary Cardiologist (physician) and Advanced Practice Providers (APPs -  Physician Assistants and Nurse Practitioners) who all work together to provide you with the care you need, when you need it.  We recommend signing up for the patient portal called "MyChart".  Sign up information is provided on this After Visit Summary.  MyChart is used to connect with patients for Virtual Visits (Telemedicine).  Patients are able to view lab/test results, encounter notes, upcoming appointments, etc.  Non-urgent messages can be sent to your provider as well.   To learn more about what you can do with MyChart,  go to [email or web address].    Your next appointment:   6 month(s)  The format for your next appointment:   Virtual Visit   Provider:   Skeet Latch, MD    Other Instructions CHECK YOUR BLOOD PRESSURE AT HOME, IF IT IS STILL HIGH CALL THE OFFICE TOMORROW BETWEEN 8:30-4:30       I,Mathew Stumpf,acting as a scribe for Skeet Latch, MD.,have documented all relevant documentation on the behalf of Skeet Latch, MD,as directed by  Skeet Latch, MD while in the presence of Skeet Latch, MD.  I, [place] Oval Linsey, MD have reviewed all documentation for this visit.  The documentation of the exam, diagnosis, procedures, and orders on 03/15/2022 are all accurate and complete.   Signed, Skeet Latch, MD  03/15/2022 8:39 AM    [place]

## 2022-03-14 NOTE — Assessment & Plan Note (Signed)
Calcium score was 84th percentile.  Continue focusing on diet and exercise as above.  Add aspirin 81 mg daily and rosuvastatin 5 mg daily.  Check lipids and a CMP in 2 months.  LDL goal is less than 70.

## 2022-03-15 ENCOUNTER — Encounter (HOSPITAL_BASED_OUTPATIENT_CLINIC_OR_DEPARTMENT_OTHER): Payer: Self-pay | Admitting: Cardiovascular Disease

## 2022-04-27 ENCOUNTER — Telehealth: Payer: Self-pay | Admitting: Cardiovascular Disease

## 2022-04-27 DIAGNOSIS — E7800 Pure hypercholesterolemia, unspecified: Secondary | ICD-10-CM

## 2022-04-27 NOTE — Telephone Encounter (Signed)
Pt c/o medication issue:  1. Name of Medication: rosuvastatin (CRESTOR) 5 MG tablet   2. How are you currently taking this medication (dosage and times per day)?    Take 1 tablet (5 mg total) by mouth daily.    3. Are you having a reaction (difficulty breathing--STAT)?    4. What is your medication issue?   Patient called stating she is having a lot of joint pain, she wants to know if she can be put on a different medication.   Pt c/o medication issue:  1. Name of Medication:   aspirin EC 81 MG tablet    2. How are you currently taking this medication (dosage and times per day)?   3. Are you having a reaction (difficulty breathing--STAT)? no  4. What is your medication issue? She stop taking it because it was causing bleeds.

## 2022-04-27 NOTE — Telephone Encounter (Signed)
Returned call to patient, phone answered by her assistant, attempted to provide the following recommendations, patient states that she is already taking the safety/ Enteric coated aspirin. She states these are not small nose bleeds but were off an on for 3 days. She states that she she has been told by prior doctors not to take these meds.   Patient is agreeable to doing 2 week statin holiday and letting us know about her symptoms.     "Would not recommend stopping Aspirin as it is guideline directed therapy for coronary artery disease. Ensure taking '81mg'$  safety or enteric coated. If having nose bleeds, use saline nasal spray twice per day.    She has been on Crestor for 5 weeks - may trial 2 week statin holiday and let us know if symptoms improve.    Loel Dubonnet, NP"

## 2022-04-27 NOTE — Telephone Encounter (Signed)
Would not recommend stopping Aspirin as it is guideline directed therapy for coronary artery disease. Ensure taking '81mg'$  safety or enteric coated. If having nose bleeds, use saline nasal spray twice per day.   She has been on Crestor for 5 weeks - may trial 2 week statin holiday and let us know if symptoms improve.   Loel Dubonnet, NP

## 2022-05-09 NOTE — Telephone Encounter (Signed)
If she wishes to see ENT about nosebleeds we can refer.   Can we call either Wednesday or Monday to check in on how muscle pains are?  Loel Dubonnet, NP

## 2022-05-10 NOTE — Telephone Encounter (Signed)
Left message for patient to call back and sent follow up mychart message with the below recommendations.    "If she wishes to see ENT about nosebleeds we can refer.    Can we call either Wednesday or Monday to check in on how muscle pains are?   Loel Dubonnet, NP"

## 2022-05-16 NOTE — Telephone Encounter (Signed)
Patient would like Dr. Blenda Mounts input, please advise

## 2022-06-14 MED ORDER — ATORVASTATIN CALCIUM 10 MG PO TABS: 10.0000 mg | ORAL_TABLET | Freq: Every day | ORAL | 3 refills | Status: DC

## 2022-06-14 NOTE — Addendum Note (Signed)
Addended by: Gerald Stabs on: 06/14/2022 09:05 AM   Modules accepted: Orders

## 2022-07-22 LAB — COMPREHENSIVE METABOLIC PANEL
ALT: 17 IU/L (ref 0–32)
AST: 17 IU/L (ref 0–40)
Albumin/Globulin Ratio: 2.2 (ref 1.2–2.2)
Albumin: 4.3 g/dL (ref 3.8–4.8)
Alkaline Phosphatase: 115 IU/L (ref 44–121)
BUN/Creatinine Ratio: 18 (ref 12–28)
BUN: 13 mg/dL (ref 8–27)
Bilirubin Total: 0.4 mg/dL (ref 0.0–1.2)
CO2: 24 mmol/L (ref 20–29)
Calcium: 9 mg/dL (ref 8.7–10.3)
Chloride: 105 mmol/L (ref 96–106)
Creatinine, Ser: 0.72 mg/dL (ref 0.57–1.00)
Globulin, Total: 2 g/dL (ref 1.5–4.5)
Glucose: 81 mg/dL (ref 70–99)
Potassium: 4.3 mmol/L (ref 3.5–5.2)
Sodium: 142 mmol/L (ref 134–144)
Total Protein: 6.3 g/dL (ref 6.0–8.5)
eGFR: 89 mL/min/{1.73_m2} (ref >59)

## 2022-07-22 LAB — LIPID PANEL
Chol/HDL Ratio: 2.4 ratio (ref 0.0–4.4)
Cholesterol, Total: 128 mg/dL (ref 100–199)
HDL: 53 mg/dL (ref >39)
LDL Chol Calc (NIH): 60 mg/dL (ref 0–99)
Triglycerides: 75 mg/dL (ref 0–149)
VLDL Cholesterol Cal: 15 mg/dL (ref 5–40)

## 2022-07-28 ENCOUNTER — Encounter (HOSPITAL_BASED_OUTPATIENT_CLINIC_OR_DEPARTMENT_OTHER): Payer: Self-pay | Admitting: Cardiovascular Disease

## 2022-07-28 MED ORDER — PRAVASTATIN SODIUM 20 MG PO TABS: 20.0000 mg | ORAL_TABLET | Freq: Every day | ORAL | 3 refills | Status: DC

## 2022-07-28 NOTE — Telephone Encounter (Signed)
Stop Rosuvastatin.  After 2 weeks, start Pravastatin 81m three times per week.  If she notes recurrent muscle aches or pains with this new medication would recommend referral to pharmacy lipid clinic.   CLoel Dubonnet NP

## 2022-07-28 NOTE — Telephone Encounter (Signed)
Please advise for alternate lipid therapy agent

## 2022-08-22 ENCOUNTER — Encounter (HOSPITAL_BASED_OUTPATIENT_CLINIC_OR_DEPARTMENT_OTHER): Payer: Self-pay | Admitting: Cardiovascular Disease

## 2022-08-22 ENCOUNTER — Telehealth (INDEPENDENT_AMBULATORY_CARE_PROVIDER_SITE_OTHER): Payer: BC Managed Care – PPO | Admitting: Cardiovascular Disease

## 2022-08-22 VITALS — BP 155/82 | HR 71 | Ht 62.0 in

## 2022-08-22 DIAGNOSIS — I2510 Atherosclerotic heart disease of native coronary artery without angina pectoris: Secondary | ICD-10-CM

## 2022-08-22 DIAGNOSIS — R030 Elevated blood-pressure reading, without diagnosis of hypertension: Secondary | ICD-10-CM

## 2022-08-22 MED ORDER — REPATHA SURECLICK 140 MG/ML ~~LOC~~ SOAJ: 140.0000 mg | SUBCUTANEOUS | 3 refills | Status: DC

## 2022-08-22 NOTE — Progress Notes (Addendum)
Virtual Visit via Video Note   Because of Audrey Yoder co-morbid illnesses, she is at least at moderate risk for complications without adequate follow up.  This format is felt to be most appropriate for this patient at this time.  All issues noted in this document were discussed and addressed.  A limited physical exam was performed with this format.  Please refer to the patient's chart for her consent to telehealth for [facility].      The patient was identified using 2 identifiers.  Date:  11/03/2022   ID:  Audrey Yoder, DOB March 02, 1951, MRN [id number]  Patient Location: Home Provider Location: Office/Clinic  PCP:  Margaree Mackintosh, MD  Cardiologist:  Chilton Si, MD  Electrophysiologist:  None   Evaluation Performed:  Follow-Up Visit  Chief Complaint:  hyperlipidemia  History of Present Illness:     The patient does not have symptoms concerning for COVID-19 infection (fever, chills, cough, or new shortness of breath).   Audrey Yoder is a 72 y.o. female with a hx of hyperlipidemia, nonobstructive CAD, obesity, and uterine cancer s/p hysterectomy and lymph node dissection, who is being seen today for the evaluation of hyperlipidemia at the request of Margaree Mackintosh, MD. She last saw her PCP and her LDL had improved from 123 to 40.  She attributes this to increased exercise.  She had a calcium score 01/2022 which revealed a score of 308, which was 84th percentile for age and gender.  Visit 02/2022 her blood pressure was elevated, but she endorsed being under a lot of stress. Started rosuvastatin and planned to work on lifestyle changes. Today, she reports that she is doing well. She has been having some issues with the statins, she has only been on the current one for 4 doses. She is still having some muscle pain with the medication, she also had a lot of inflammation and swelling in her fingers. She has not been monitoring her blood pressure at home  regularly and said that she was rushing to get it which made it high when she took it at home this time, it was 155/82. She stated that when she did take it the reading was around or lower than 130/80.   She denies any palpitations, chest pain, shortness of breath, or peripheral edema. No lightheadedness, headaches, syncope, orthopnea, or PND.    Past Medical History:  Diagnosis Date   CAD in native artery 03/14/2022   Elevated blood pressure reading 03/14/2022   Endometrial ca [facility])    Hx of colonic polyps 03/31/2016    Past Surgical History:  Procedure Laterality Date   ABDOMINAL HYSTERECTOMY     TOTAL VAGINAL HYSTERECTOMY      Current Medications: Current Meds  Medication Sig   aspirin EC 81 MG tablet Take 81 mg by mouth daily. Swallow whole.   cholecalciferol (VITAMIN D3) 25 MCG (1000 UNIT) tablet Take 1,000 Units by mouth daily.   Evolocumab (REPATHA SURECLICK) 140 MG/ML SOAJ Inject 140 mg into the skin every 14 (fourteen) days.   Multiple Vitamin (MULTIVITAMIN) tablet Take 1 tablet by mouth daily.     Allergies:   Sulfa antibiotics and Levofloxacin   Social History   Socioeconomic History   Marital status: Married    Spouse name: Not on file   Number of children: Not on file   Years of education: Not on file   Highest education level: Not on file  Occupational History   Not on file  Tobacco Use   Smoking status: Never   Smokeless tobacco: Never  Substance and Sexual Activity   Alcohol use: Yes    Alcohol/week: 2.0 standard drinks of alcohol    Types: 2 Glasses of wine per week   Drug use: No   Sexual activity: Not on file  Other Topics Concern   Not on file  Social History Narrative   Married, is an elder Social worker and estate attorney   Social Determinants of Health   Financial Resource Strain: Not on file  Food Insecurity: No Food Insecurity (03/14/2022)   Hunger Vital Sign    Worried About Running Out of Food in the Last Year: Never true    Ran Out of Food  in the Last Year: Never true  Transportation Needs: Not on file  Physical Activity: Insufficiently Active (03/14/2022)   Exercise Vital Sign    Days of Exercise per Week: 7 days    Minutes of Exercise per Session: 20 min  Stress: Not on file  Social Connections: Not on file     Family History: The patient's family history includes COPD in her paternal grandfather; Stroke in her maternal uncle; Stroke (age of onset: 79) in her mother.  ROS:   Please see the history of present illness.    (+) myalgias (+) inflammation (+) swelling (fingers) All other systems reviewed and are negative.  EKGs/Labs/Other Studies Reviewed:    The following studies were reviewed today:  CT Calcium Score  01/30/2022: FINDINGS: Coronary arteries: Normal origins.   Coronary Calcium Score:   Left main: 0   Left anterior descending artery: 187   Left circumflex artery: 62.2   Right coronary artery: 58.9   Total: 308   Percentile: 84th   Pericardium: Normal.   Ascending Aorta: Normal caliber.  3.2 cm.   Non-cardiac: See separate report from [facility].   IMPRESSION: Coronary calcium score of 308. This was 84th percentile for age-, race-, and sex-matched controls.   EKG:  EKG is personally reviewed.  EKG is personally reviewed. 03/14/2022: Sinus rhythm. Rate 79 bpm. Low voltage.  Recent Labs: 11/28/2021: Hemoglobin 14.6; Platelets 253; TSH 0.50 07/21/2022: ALT 17; BUN 13; Creatinine, Ser 0.72; Potassium 4.3; Sodium 142   Recent Lipid Panel    Component Value Date/Time   CHOL 128 07/21/2022 1106   TRIG 75 07/21/2022 1106   HDL 53 07/21/2022 1106   CHOLHDL 2.4 07/21/2022 1106   CHOLHDL 3.5 11/28/2021 0910   VLDL 18 11/14/2016 1030   LDLCALC 60 07/21/2022 1106   LDLCALC 94 11/28/2021 0910     Risk Assessment/Calculations:           Physical Exam:    Wt Readings from Last 3 Encounters:  03/14/22 225 lb (102.1 kg)  12/01/21 227 lb (103 kg)  11/30/20 224 lb (101.6  kg)     VS:  BP (!) 155/82   Pulse 71   Ht 5\' 2"  (1.575 m)   BMI 41.15 kg/m  , BMI Body mass index is 41.15 kg/m. GENERAL:  Well appearing HEENT: Pupils equal round and reactive, fundi not visualized, oral mucosa unremarkable NECK:  No jugular venous distention, waveform within normal limits, carotid upstroke brisk and symmetric, no bruits, no thyromegaly LUNGS:  Clear to auscultation bilaterally HEART:  RRR.  PMI not displaced or sustained,S1 and S2 within normal limits, no S3, no S4, no clicks, no rubs, no murmurs EXT:  2 plus pulses throughout, no edema, no cyanosis no clubbing SKIN:  No rashes  no nodules NEURO:  Cranial nerves II through XII grossly intact, motor grossly intact throughout PSYCH:  Cognitively intact, oriented to person place and time   ASSESSMENT:    1. CAD in native artery   2. Elevated blood pressure reading     PLAN:    CAD in native artery Non-obstructive CAD.  She has been exercising and has no anginal symptoms.  Calcium score 01/2022 was 308, which was 84th percentile.  Lipids are not at goal.  She has not tolerated rosuvastatin or pravastatin.  We will switch to Repatha.  Repeat lipids/CMP in 2-3 months.  She is interested in meeting with our pharmacists about this medication.  Continue working on diet and exercise.  Continue aspirin.  Elevated blood pressure reading Blood pressure elevated in the office.  She notes that it is controlled at home and she has been under stress.  Recommend continuing to monitor.  It may be white coat hypertension.    Disposition:  FU with PHARMD in 4 weeks. FU with Mendy Chou C. Duke Salvia, MD, [facility] in 6 months  Medication Adjustments/Labs and Tests Ordered: Current medicines are reviewed at length with the patient today.  Concerns regarding medicines are outlined above.   No orders of the defined types were placed in this encounter.  Meds ordered this encounter  Medications   Evolocumab (REPATHA SURECLICK) 140 MG/ML  SOAJ    Sig: Inject 140 mg into the skin every 14 (fourteen) days.    Dispense:  6 mL    Refill:  3    D/C PRAVASTATIN   Patient Instructions  Medication Instructions:  STOP PRAVASTATIN   START REPATHA INJECT EVERY 14 DAYS   *If you need a refill on your cardiac medications before your next appointment, please call your pharmacy*  Lab Work: NONE  Testing/Procedures: NONE  Follow-Up: PHARM D   DR [place]   Other Instructions MONITOR BLOOD PRESSURE TWICE A DAY, LOG. BRING LOG AND MACHINE TO FOLLOW UP VISIT    I,Jessica Ford,acting as a scribe for Chilton Si, MD.,have documented all relevant documentation on the behalf of Chilton Si, MD,as directed by  Chilton Si, MD while in the presence of Chilton Si, MD.   I, Darby Fleeman C. Duke Salvia, MD have reviewed all documentation for this visit.  The documentation of the exam, diagnosis, procedures, and orders on 09/16/2022 are all accurate and complete.  COVID-19 Education: The signs and symptoms of COVID-19 were discussed with the patient and how to seek care for testing (follow up with PCP or arrange E-visit).  The importance of social distancing was discussed today.  Time:   Today, I have spent 17 minutes with the patient with telehealth technology discussing the above problems.   Signed, Chilton Si, MD  09/16/2022 7:53 AM    [facility]

## 2022-08-22 NOTE — Patient Instructions (Addendum)
Medication Instructions:  STOP PRAVASTATIN   START REPATHA INJECT EVERY 14 DAYS   *If you need a refill on your cardiac medications before your next appointment, please call your pharmacy*  Lab Work: NONE  Testing/Procedures: NONE  Follow-Up: PHARM D   DR [place]   Other Instructions MONITOR BLOOD PRESSURE TWICE A DAY, LOG. BRING LOG AND MACHINE TO FOLLOW UP VISIT

## 2022-09-08 ENCOUNTER — Telehealth (HOSPITAL_BASED_OUTPATIENT_CLINIC_OR_DEPARTMENT_OTHER): Payer: BC Managed Care – PPO | Admitting: Cardiovascular Disease

## 2022-09-16 ENCOUNTER — Encounter (HOSPITAL_BASED_OUTPATIENT_CLINIC_OR_DEPARTMENT_OTHER): Payer: Self-pay | Admitting: Cardiovascular Disease

## 2022-09-16 NOTE — Assessment & Plan Note (Addendum)
Non-obstructive CAD.  She has been exercising and has no anginal symptoms.  Calcium score 01/2022 was 308, which was 84th percentile.  Lipids are not at goal.  She has not tolerated rosuvastatin or pravastatin.  We will switch to Repatha.  Repeat lipids/CMP in 2-3 months.  She is interested in meeting with our pharmacists about this medication.  Continue working on diet and exercise.  Continue aspirin.

## 2022-09-16 NOTE — Assessment & Plan Note (Signed)
Blood pressure elevated in the office.  She notes that it is controlled at home and she has been under stress.  Recommend continuing to monitor.  It may be white coat hypertension.

## 2022-09-25 NOTE — Progress Notes (Signed)
Office Visit    Patient Name: Audrey Yoder Date of Encounter: 09/26/2022  Primary Care Provider:  Margaree Mackintosh, MD Primary Cardiologist:  Chilton Si, MD  Chief Complaint    Hyperlipidemia, hypertension  Significant Past Medical History   CAD CAC = 308 (84th percentile)  hypertension White coat? - not currently on medications  obesity BMI 41.15      Allergies  Allergen Reactions   Sulfa Antibiotics Rash   Levofloxacin     Joint pain    History of Present Illness    Audrey Yoder is a 72 y.o. female patient of Dr Duke Salvia, in the office today to discuss options for cholesterol management.  She was originally started on rosuvastatin 5 mg daily, but had to stop after 3-4 weeks due to myalgias.  Symptoms resolved and she was then challenged with atorvastatin 10 mg.  She tolerated this for about 8-10 weeks and myalgias and weakness returned.  Pravastatin 20 mg three times weekly caused similar problems.  She was then prescribed Repatha 140 mg, but she asked to meet with PharmD, as she had multiple questions.    Today she notes that she took the first dose of Repatha 3 days ago.  Since then she has been having arm and wrist pain, and noticed her balance is a bit off ("trouble finding her step").   She was also asked to monitor home BP readings, and she did bring her cuff in today for validation, but forgot the list of readings.    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  LDL Cholesterol goal:  LDL < 70  Current Medications:  Repatha 140 mg  BP meds: none  Previously tried:  rosuvastatin, pravastatin  Family Hx:   mother had multiple strokes died at 2, father died at 89; paternal GF with CAD, brother deceased myeloprolific disease RBC; 2 children, 1 with fatty liver  Social Hx: Tobacco: no Alcohol:   occasional wine   Diet:  mostly home cooked meals, prefers chicken over red meat; snacks on nuts, Triscuits, does like cheeses, dairy;   Exercise: treadmill 10-15  min/day; is limited by tendon pain in her legs   Accessory Clinical Findings   Lab Results  Component Value Date   CHOL 128 07/21/2022   HDL 53 07/21/2022   LDLCALC 60 07/21/2022   TRIG 75 07/21/2022   CHOLHDL 2.4 07/21/2022    Lab Results  Component Value Date   ALT 17 07/21/2022   AST 17 07/21/2022   ALKPHOS 115 07/21/2022   BILITOT 0.4 07/21/2022   Lab Results  Component Value Date   CREATININE 0.72 07/21/2022   BUN 13 07/21/2022   NA 142 07/21/2022   K 4.3 07/21/2022   CL 105 07/21/2022   CO2 24 07/21/2022   Lab Results  Component Value Date   HGBA1C 5.3 11/28/2021    Home Medications    Current Outpatient Medications  Medication Sig Dispense Refill   cholecalciferol (VITAMIN D3) 25 MCG (1000 UNIT) tablet Take 1,000 Units by mouth daily.     Multiple Vitamin (MULTIVITAMIN) tablet Take 1 tablet by mouth daily.     No current facility-administered medications for this visit.     Assessment & Plan    Elevated LDL cholesterol level Assessment: Patient with ASCVD not at LDL goal of < 70 Most recent LDL 60 on 07/21/22  (had 3-4 weeks of atorvastatin prior to draw) Not able to tolerate statins secondary to myalgias:  pravastatin, atorvastatin, rosuvastatin Patient noting similar  myalgias since took first dose of Repatha this past Saturday Reviewed options for lowering LDL cholesterol, including ezetimibe, bempedoic acid and inclisiran.  Discussed mechanisms of action, dosing, side effects, potential decreases in LDL cholesterol and costs.  Also reviewed potential options for patient assistance.  Plan: Patient will try second dose of Repatha in 2-3 weeks, then discontinue if side effects persist. Repeat labs after:  3 months without medication Lipid Liver function    Elevated blood pressure reading Assessment: BP is uncontrolled in office BP 151/100 mmHg;  above the goal (<130/80). Home meter reads within 10 points of office cuff - did not bring home  readings but states they are better Not currently on medication for hypertension Reiterated the importance of regular exercise and low salt diet   Plan:  Patient to keep record of BP readings with heart rate and report to [place] at the next visit Patient to follow up with PharmD in 6 weeks for follow up  Labs ordered today:  none   Phillips Hay, PharmD CPP [facility] [address] [place], [place] [address] [phone]  09/26/2022, 11:51 AM

## 2022-09-26 ENCOUNTER — Encounter (HOSPITAL_BASED_OUTPATIENT_CLINIC_OR_DEPARTMENT_OTHER): Payer: Self-pay | Admitting: Pharmacist Clinician (PhC)/ Clinical Pharmacy Specialist

## 2022-09-26 ENCOUNTER — Ambulatory Visit (HOSPITAL_BASED_OUTPATIENT_CLINIC_OR_DEPARTMENT_OTHER): Payer: BC Managed Care – PPO | Admitting: Pharmacist Clinician (PhC)/ Clinical Pharmacy Specialist

## 2022-09-26 VITALS — BP 151/100 | HR 72

## 2022-09-26 DIAGNOSIS — R030 Elevated blood-pressure reading, without diagnosis of hypertension: Secondary | ICD-10-CM | POA: Diagnosis not present

## 2022-09-26 DIAGNOSIS — E7800 Pure hypercholesterolemia, unspecified: Secondary | ICD-10-CM

## 2022-09-26 NOTE — Assessment & Plan Note (Signed)
Assessment: Patient with ASCVD not at LDL goal of < 70 Most recent LDL 60 on 07/21/22  (had 3-4 weeks of atorvastatin prior to draw) Not able to tolerate statins secondary to myalgias:  pravastatin, atorvastatin, rosuvastatin Patient noting similar myalgias since took first dose of Repatha this past Saturday Reviewed options for lowering LDL cholesterol, including ezetimibe, bempedoic acid and inclisiran.  Discussed mechanisms of action, dosing, side effects, potential decreases in LDL cholesterol and costs.  Also reviewed potential options for patient assistance.  Plan: Patient will try second dose of Repatha in 2-3 weeks, then discontinue if side effects persist. Repeat labs after:  3 months without medication Lipid Liver function

## 2022-09-26 NOTE — Patient Instructions (Addendum)
Your Results:             Your most recent labs Goal  Total Cholesterol 128 < 200  Triglycerides 75 < 150  HDL (happy/good cholesterol) 53 > 40  LDL (lousy/bad cholesterol 60 < 70   Look at [facility] Plan F or G  -they cover 100% of  what Medicare does not  Stop Repatha  Check you blood pressure once or twice daily, at least 3-4 days each week. Keep track of the numbers and bring them back to your next appointment.   Thank you for choosing [facility]

## 2022-09-26 NOTE — Assessment & Plan Note (Addendum)
Assessment: BP is uncontrolled in office BP 151/100 mmHg;  above the goal (<130/80). Home meter reads within 10 points of office cuff - did not bring home readings but states they are better Not currently on medication for hypertension Reiterated the importance of regular exercise and low salt diet   Plan:  Patient to keep record of BP readings with heart rate and report to [place] at the next visit Patient to follow up with PharmD in 6 weeks for follow up  Labs ordered today:  none

## 2022-10-24 LAB — HM MAMMOGRAPHY

## 2022-10-24 LAB — HM DEXA SCAN

## 2022-10-26 ENCOUNTER — Encounter: Payer: Self-pay | Admitting: Internal Medicine

## 2022-10-27 ENCOUNTER — Telehealth: Payer: Self-pay | Admitting: Internal Medicine

## 2022-10-27 NOTE — Telephone Encounter (Signed)
Audrey Yoder [phone]  [name] called to say she has been having some nausea, diarrhea and losing weight and pain at times just above the waist line on the right above the belly bottom for a few months, she is wonder could it be her gall bladder. She has not seen gastrologist in a long time.

## 2022-10-27 NOTE — Telephone Encounter (Signed)
scheduled

## 2022-11-03 ENCOUNTER — Ambulatory Visit: Payer: BC Managed Care – PPO | Admitting: Internal Medicine

## 2022-11-03 ENCOUNTER — Encounter: Payer: Self-pay | Admitting: Internal Medicine

## 2022-11-03 VITALS — BP 130/70 | HR 69 | Temp 98.9°F | Ht 62.0 in | Wt 215.0 lb

## 2022-11-03 DIAGNOSIS — R197 Diarrhea, unspecified: Secondary | ICD-10-CM

## 2022-11-03 DIAGNOSIS — R143 Flatulence: Secondary | ICD-10-CM | POA: Diagnosis not present

## 2022-11-03 DIAGNOSIS — R1013 Epigastric pain: Secondary | ICD-10-CM | POA: Diagnosis not present

## 2022-11-03 DIAGNOSIS — R5383 Other fatigue: Secondary | ICD-10-CM | POA: Diagnosis not present

## 2022-11-03 MED ORDER — DICYCLOMINE HCL 20 MG PO TABS: 20.0000 mg | ORAL_TABLET | Freq: Four times a day (QID) | ORAL | 1 refills | Status: DC

## 2022-11-03 NOTE — Patient Instructions (Addendum)
Referral to [facility]. Past due for colonoscopy. Try Bentyl one half hour before meals. Labs drawn and are pending. Pt requesting CA125.

## 2022-11-03 NOTE — Progress Notes (Signed)
Patient Care Team: Margaree Mackintosh, MD as PCP - General (Internal Medicine) Chilton Si, MD as PCP - Cardiology (Cardiology)  Visit Date: 11/03/22  Subjective:    Patient ID: Audrey Yoder , Female   DOB: [birth year], 72 y.o.    MRN: [id number]   72 y.o. Female presents today for right lower quadrant pain that she has had for some time. Has decreased appetite.   2017 colonoscopy showed sessile serrated polyp/adenoma.  Past Medical History:  Diagnosis Date   CAD in native artery 03/14/2022   Elevated blood pressure reading 03/14/2022   Endometrial ca [facility])    Hx of colonic polyps 03/31/2016     Family History  Problem Relation Age of Onset   Stroke Mother 35   Stroke Maternal Uncle    COPD Paternal Grandfather     Social History   Social History Narrative   Married, is an elder Social worker and estate attorney      Review of Systems  Constitutional:  Negative for fever and malaise/fatigue.  HENT:  Negative for congestion.   Eyes:  Negative for blurred vision.  Respiratory:  Negative for cough and shortness of breath.   Cardiovascular:  Negative for chest pain, palpitations and leg swelling.  Gastrointestinal:  Positive for abdominal pain (RLQ). Negative for vomiting.  Musculoskeletal:  Negative for back pain.  Skin:  Negative for rash.  Neurological:  Negative for loss of consciousness and headaches.        Objective:   Vitals: BP 130/70   Pulse 69   Temp 98.9 F (37.2 C) (Tympanic)   Ht 5\' 2"  (1.575 m)   Wt 215 lb (97.5 kg)   SpO2 97%   BMI 39.32 kg/m    Physical Exam Vitals and nursing note reviewed.  Constitutional:      General: She is not in acute distress.    Appearance: Normal appearance. She is not toxic-appearing.  HENT:     Head: Normocephalic and atraumatic.  Pulmonary:     Effort: Pulmonary effort is normal.  Abdominal:     General: Abdomen is flat. Bowel sounds are normal. There is no distension.     Palpations: Abdomen is soft.  There is no hepatomegaly, splenomegaly or mass.     Tenderness: There is no abdominal tenderness.  Skin:    General: Skin is warm and dry.  Neurological:     Mental Status: She is alert and oriented to person, place, and time. Mental status is at baseline.  Psychiatric:        Mood and Affect: Mood normal.        Behavior: Behavior normal.        Thought Content: Thought content normal.        Judgment: Judgment normal.       Results:   Studies obtained and personally reviewed by me:    Labs:       Component Value Date/Time   NA 142 07/21/2022 1106   K 4.3 07/21/2022 1106   CL 105 07/21/2022 1106   CO2 24 07/21/2022 1106   GLUCOSE 81 07/21/2022 1106   GLUCOSE 88 11/28/2021 0910   BUN 13 07/21/2022 1106   CREATININE 0.72 07/21/2022 1106   CREATININE 0.74 11/28/2021 0910   CALCIUM 9.0 07/21/2022 1106   PROT 6.3 07/21/2022 1106   ALBUMIN 4.3 07/21/2022 1106   AST 17 07/21/2022 1106   ALT 17 07/21/2022 1106   ALKPHOS 115 07/21/2022 1106   BILITOT 0.4 07/21/2022  1106   GFRNONAA 88 11/25/2020 1234   GFRAA 102 11/25/2020 1234     Lab Results  Component Value Date   WBC 5.5 11/28/2021   HGB 14.6 11/28/2021   HCT 42.7 11/28/2021   MCV 92.2 11/28/2021   PLT 253 11/28/2021    Lab Results  Component Value Date   CHOL 128 07/21/2022   HDL 53 07/21/2022   LDLCALC 60 07/21/2022   TRIG 75 07/21/2022   CHOLHDL 2.4 07/21/2022    Lab Results  Component Value Date   HGBA1C 5.3 11/28/2021     Lab Results  Component Value Date   TSH 0.50 11/28/2021      Assessment & Plan:   Abdominal pain RLQ: prescribed Bentyl 20 mg half hour before meal.  Reminded to schedule colonoscopy. Last done 2017 with 2020 recall.  Ordered CBC with Diff/Plat, CMET, Amylase, Lipase, TSH/T4, CA125.    I,Alexander Ruley,acting as a Neurosurgeon for Margaree Mackintosh, MD.,have documented all relevant documentation on the behalf of Margaree Mackintosh, MD,as directed by  Margaree Mackintosh, MD while in the  presence of Margaree Mackintosh, MD.   ***

## 2022-11-06 LAB — T4, FREE: Free T4: 1.1 ng/dL (ref 0.8–1.8)

## 2022-11-06 LAB — CBC WITH DIFFERENTIAL/PLATELET
Absolute Monocytes: 587 cells/uL (ref 200–950)
Basophils Absolute: 28 cells/uL (ref 0–200)
Basophils Relative: 0.4 %
Eosinophils Absolute: 69 cells/uL (ref 15–500)
Eosinophils Relative: 1 %
HCT: 44 % (ref 35.0–45.0)
Hemoglobin: 14.9 g/dL (ref 11.7–15.5)
Lymphs Abs: 952 cells/uL (ref 850–3900)
MCH: 30.5 pg (ref 27.0–33.0)
MCHC: 33.9 g/dL (ref 32.0–36.0)
MCV: 90 fL (ref 80.0–100.0)
MPV: 9.9 fL (ref 7.5–12.5)
Monocytes Relative: 8.5 %
Neutro Abs: 5265 cells/uL (ref 1500–7800)
Neutrophils Relative %: 76.3 %
Platelets: 282 10*3/uL (ref 140–400)
RBC: 4.89 10*6/uL (ref 3.80–5.10)
RDW: 12.5 % (ref 11.0–15.0)
Total Lymphocyte: 13.8 %
WBC: 6.9 10*3/uL (ref 3.8–10.8)

## 2022-11-06 LAB — COMPLETE METABOLIC PANEL WITH GFR
AG Ratio: 1.7 (calc) (ref 1.0–2.5)
ALT: 16 U/L (ref 6–29)
AST: 16 U/L (ref 10–35)
Albumin: 4.3 g/dL (ref 3.6–5.1)
Alkaline phosphatase (APISO): 107 U/L (ref 37–153)
BUN: 20 mg/dL (ref 7–25)
CO2: 29 mmol/L (ref 20–32)
Calcium: 9.8 mg/dL (ref 8.6–10.4)
Chloride: 106 mmol/L (ref 98–110)
Creat: 0.87 mg/dL (ref 0.60–1.00)
Globulin: 2.6 g/dL (calc) (ref 1.9–3.7)
Glucose, Bld: 93 mg/dL (ref 65–99)
Potassium: 5.4 mmol/L — ABNORMAL HIGH (ref 3.5–5.3)
Sodium: 143 mmol/L (ref 135–146)
Total Bilirubin: 0.5 mg/dL (ref 0.2–1.2)
Total Protein: 6.9 g/dL (ref 6.1–8.1)
eGFR: 70 mL/min/{1.73_m2} (ref >=60)

## 2022-11-06 LAB — LIPASE: Lipase: 51 U/L (ref 7–60)

## 2022-11-06 LAB — CA 125: CA 125: 7 U/mL (ref <35)

## 2022-11-06 LAB — AMYLASE: Amylase: 53 U/L (ref 21–101)

## 2022-11-06 LAB — TSH: TSH: 0.63 mIU/L (ref 0.40–4.50)

## 2022-11-16 ENCOUNTER — Encounter (HOSPITAL_BASED_OUTPATIENT_CLINIC_OR_DEPARTMENT_OTHER): Payer: Self-pay | Admitting: Pharmacist Clinician (PhC)/ Clinical Pharmacy Specialist

## 2022-11-16 ENCOUNTER — Ambulatory Visit (INDEPENDENT_AMBULATORY_CARE_PROVIDER_SITE_OTHER): Payer: BC Managed Care – PPO | Admitting: Pharmacist Clinician (PhC)/ Clinical Pharmacy Specialist

## 2022-11-16 VITALS — BP 149/83 | HR 70 | Ht 62.5 in | Wt 216.0 lb

## 2022-11-16 DIAGNOSIS — E7800 Pure hypercholesterolemia, unspecified: Secondary | ICD-10-CM | POA: Diagnosis not present

## 2022-11-16 DIAGNOSIS — R030 Elevated blood-pressure reading, without diagnosis of hypertension: Secondary | ICD-10-CM | POA: Diagnosis not present

## 2022-11-16 NOTE — Assessment & Plan Note (Signed)
Patient with LDL most recently at 60, but baseline around 110-120.  Did not tolerate statins or Repatha.  Has calcium score of 308 (84th percentile).  She is not interested in statins or PCSK9 inhibitors.  At previous visit had discussed all options.  Reviewed ezetimibe and bempedoic acid.  Not interested in bempedoic acid due to risk of ruptured tendon.  She will review information about ezetimibe and I suggested if she is interested, she can send a message through MyChart and we will get that for her.

## 2022-11-16 NOTE — Patient Instructions (Addendum)
Follow up appointment: with Dr. Duke Salvia in September  Take your BP meds as follows: no medications at this time.  If your home BP readings average > 130/80 consistently, please let Dr. Lenord Fellers know  Look at ezetimibe (Zetia) as a possible cholesterol medication.  Check your blood pressure at home daily (if able) and keep record of the readings.  Hypertension "High blood pressure"  Hypertension is often called "The Silent Killer." It rarely causes symptoms until it is extremely  high or has done damage to other organs in the body. For this reason, you should have your  blood pressure checked regularly by your physician. We will check your blood pressure  every time you see a provider at one of our offices.   Your blood pressure reading consists of two numbers. Ideally, blood pressure should be  below 120/80. The first ("top") number is called the systolic pressure. It measures the  pressure in your arteries as your heart beats. The second ("bottom") number is called the diastolic pressure. It measures the pressure in your arteries as the heart relaxes between beats.  The benefits of getting your blood pressure under control are enormous. A 10-point  reduction in systolic blood pressure can reduce your risk of stroke by 27% and heart failure by 28%  Your blood pressure goal is < 130/80  To check your pressure at home you will need to:  1. Sit up in a chair, with feet flat on the floor and back supported. Do not cross your ankles or legs. 2. Rest your left arm so that the cuff is about heart level. If the cuff goes on your upper arm,  then just relax the arm on the table, arm of the chair or your lap. If you have a wrist cuff, we  suggest relaxing your wrist against your chest (think of it as Pledging the Flag with the  wrong arm).  3. Place the cuff snugly around your arm, about 1 inch above the crook of your elbow. The  cords should be inside the groove of your elbow.  4. Sit  quietly, with the cuff in place, for about 5 minutes. After that 5 minutes press the power  button to start a reading. 5. Do not talk or move while the reading is taking place.  6. Record your readings on a sheet of paper. Although most cuffs have a memory, it is often  easier to see a pattern developing when the numbers are all in front of you.  7. You can repeat the reading after 1-3 minutes if it is recommended  Make sure your bladder is empty and you have not had caffeine or tobacco within the last 30 min  Always bring your blood pressure log with you to your appointments. If you have not brought your monitor in to be double checked for accuracy, please bring it to your next appointment.  You can find a list of quality blood pressure cuffs at validatebp.org

## 2022-11-16 NOTE — Progress Notes (Signed)
Office Visit    Patient Name: Audrey Yoder Date of Encounter: 11/16/2022  Primary Care Provider:  Margaree Mackintosh, MD Primary Cardiologist:  Chilton Si, MD  Chief Complaint    Hyperlipidemia, hypertension  Significant Past Medical History   CAD CAC = 308 (84th percentile)  hypertension White coat? - not currently on medications  obesity BMI 41.15      Allergies  Allergen Reactions   Sulfa Antibiotics Rash   Levofloxacin     Joint pain   Statins     Muscle pain    History of Present Illness    Audrey Yoder is a 72 y.o. female patient of Dr Duke Salvia, in the office today for hypertension follow up.  I saw her in April to discuss cholesterol management, and at the time her BP was noted to be 151/100.  She had her home BP device with her and it was shown to be accurate with the office device.  She was not taking any medications for blood pressure at the time, so she was asked to monitor home readings and return for follow up in 2 months.  Today she is in the office for follow up.  In regards to her cholesterol, she took a second dose of Repatha, but noted it caused her to have muscle pain and weakness, that lasted for about 3 weeks.  In this time she had 2 falls, as her legs felt weaker than normal.  She currently does not take any medication for BP, and has been monitoring at home for the past 2 months.     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  BP goal:  < 130/80  Current Medications: none  Family Hx:   mother had multiple strokes died at 77, father died at 87; paternal GF with CAD, brother deceased myeloprolific disease RBC; 2 children, 1 with fatty liver  Social Hx: Tobacco: no Alcohol:   occasional wine   Caffeine: none  Diet:  mostly home cooked meals, prefers chicken over red meat; snacks on nuts, Triscuits, does like cheeses, dairy;   Exercise: treadmill 15-20 min/day; is limited by tendon pain in her legs   Accessory Clinical Findings   Lab  Results  Component Value Date   CHOL 128 07/21/2022   HDL 53 07/21/2022   LDLCALC 60 07/21/2022   TRIG 75 07/21/2022   CHOLHDL 2.4 07/21/2022    Lab Results  Component Value Date   ALT 16 11/03/2022   AST 16 11/03/2022   ALKPHOS 115 07/21/2022   BILITOT 0.5 11/03/2022   Lab Results  Component Value Date   CREATININE 0.87 11/03/2022   BUN 20 11/03/2022   NA 143 11/03/2022   K 5.4 (H) 11/03/2022   CL 106 11/03/2022   CO2 29 11/03/2022   Lab Results  Component Value Date   HGBA1C 5.3 11/28/2021    Home Medications    Current Outpatient Medications  Medication Sig Dispense Refill   cholecalciferol (VITAMIN D3) 25 MCG (1000 UNIT) tablet Take 1,000 Units by mouth daily.     dicyclomine (BENTYL) 20 MG tablet Take 1 tablet (20 mg total) by mouth every 6 (six) hours. 30 tablet 1   Multiple Vitamin (MULTIVITAMIN) tablet Take 1 tablet by mouth daily.     No current facility-administered medications for this visit.     Assessment & Plan    Elevated blood pressure reading Assessment: BP is uncontrolled in office BP 146/85 mmHg;  above the goal (<130/80). Currently not on any  BP lowering medications Denies SOB, palpitation, chest pain, headaches,or swelling Reiterated the importance of regular exercise and low salt diet   Plan:  Continue monitoring home BP readings several times each week.  Average those on a monthly basis.  Goal is to have average BP < 130/80 She is not interested in starting BP medications at this time. Reviewed need for changes in lifestyle, including an increase in exercise as well as weight loss in order to have some impact on her BP readings. She should review home BP information with Dr. Lenord Fellers at her next visit Patient to follow up with Dr. Duke Salvia in September  Labs ordered today:  none   Elevated LDL cholesterol level Patient with LDL most recently at 60, but baseline around 110-120.  Did not tolerate statins or Repatha.  Has calcium score of  308 (84th percentile).  She is not interested in statins or PCSK9 inhibitors.  At previous visit had discussed all options.  Reviewed ezetimibe and bempedoic acid.  Not interested in bempedoic acid due to risk of ruptured tendon.  She will review information about ezetimibe and I suggested if she is interested, she can send a message through MyChart and we will get that for her.     Phillips Hay, PharmD CPP [facility] [address] [place], [place] [address] [phone]  11/16/2022, 9:11 AM

## 2022-11-16 NOTE — Assessment & Plan Note (Signed)
Assessment: BP is uncontrolled in office BP 146/85 mmHg;  above the goal (<130/80). Currently not on any BP lowering medications Denies SOB, palpitation, chest pain, headaches,or swelling Reiterated the importance of regular exercise and low salt diet   Plan:  Continue monitoring home BP readings several times each week.  Average those on a monthly basis.  Goal is to have average BP < 130/80 She is not interested in starting BP medications at this time. Reviewed need for changes in lifestyle, including an increase in exercise as well as weight loss in order to have some impact on her BP readings. She should review home BP information with Dr. Lenord Fellers at her next visit Patient to follow up with Dr. Duke Salvia in September  Labs ordered today:  none

## 2022-11-27 NOTE — Progress Notes (Signed)
Patient Care Team: Margaree Mackintosh, MD as PCP - General (Internal Medicine) Chilton Si, MD as PCP - Cardiology (Cardiology)  Visit Date: 12/04/22  Subjective:    Patient ID: Audrey Yoder , Female   DOB: [birth year], 72 y.o.    MRN: [id number]   72 y.o. Female presents today for annual comprehensive physical exam.   History of IBS treated with dicyclomine 20 mg every six hours.  Takes Vitamin D3 1,000 units daily, multivitamin.  History of uterine cancer in 2010 and underwent modified radical hysterectomy with BSO and bilateral lymph node dissection January 2011 at [facility].  She had negative nodes.  She had vaginal cuff brachytherapy for 3 doses 1 month apart as adjuvant therapy.  Has had no further issues and has been released from follow-up.  History of left knee osteoarthritis 2008 treated with steroid injection by orthopedist.  In 2014 she was treated for presumed tickborne illness at an urgent care center in [place].  She reported flulike symptoms at the time after having a recent tick bite.  History of kidney stones.  Has not seen urologist.  Usually does not seek medical care when she has an attack.  She has well water.  Last serious attack was in 2019.  She has not had CT of abdomen and pelvis since 2016.  She did have a kidney stone in 2015 and had stone analysis through this office.  Stone was predominantly uric acid with 20% calcium oxalate.  She subsequently had uric acid level drawn and it was high normal at 6.9 but she is not on gout medication and has never had gout.  HGBA1c at 5.6% on 12/01/22, up from 5.3% one year ago. LDL elevated at 115. Does not tolerate statins, Repatha.  Lost 14 pounds between 12/01/21 and today. Reports appetite has been diminished. No increase in stress.  Mammogram last completed 10/24/22. No mammographic evidence of malignancy. Recommended repeat in 2025.  Last colonoscopy was in 2017 and she was reminded about this today.  Had  1 sessile serrated adenoma removed.  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  Does not smoke.  Occasional wine consumption.  She has 2 adult sons and grandchildren.  She is married.  Family history: Father with history of myeloproliferative disorder.  Mother with history of stroke.  1 brother with history of hemochromatosis.  She has been screened for hemochromatosis in 2020 and her ferritin level was normal at 71.   Past Medical History:  Diagnosis Date   CAD in native artery 03/14/2022   Elevated blood pressure reading 03/14/2022   Endometrial ca [facility])    Hx of colonic polyps 03/31/2016     Family History  Problem Relation Age of Onset   Stroke Mother 16   Other Father        Miloprolific disease   Other Brother        Miloprolific disease   Stroke Maternal Uncle    COPD Paternal Grandfather     Social History   Social History Narrative   Married, is an elder Social worker and estate attorney      Review of Systems  Constitutional:  Negative for chills, fever, malaise/fatigue and weight loss.  HENT:  Negative for hearing loss, sinus pain and sore throat.   Respiratory:  Negative for cough, hemoptysis and shortness of breath.   Cardiovascular:  Negative for chest pain, palpitations, leg swelling and PND.  Gastrointestinal:  Negative for  abdominal pain, constipation, diarrhea, heartburn, nausea and vomiting.  Genitourinary:  Negative for dysuria, frequency and urgency.  Musculoskeletal:  Negative for back pain, myalgias and neck pain.  Skin:  Negative for itching and rash.  Neurological:  Negative for dizziness, tingling, seizures and headaches.  Endo/Heme/Allergies:  Negative for polydipsia.  Psychiatric/Behavioral:  Negative for depression. The patient is not nervous/anxious.         Objective:   Vitals: BP 122/70   Pulse 89   Temp 98.2 F (36.8 C) (Tympanic)   Resp 16   Ht 5\' 2"  (1.575 m)   Wt 213 lb (96.6 kg)   SpO2 96%   BMI  38.96 kg/m    Physical Exam Vitals and nursing note reviewed.  Constitutional:      General: She is not in acute distress.    Appearance: Normal appearance. She is not ill-appearing or toxic-appearing.  HENT:     Head: Normocephalic and atraumatic.     Right Ear: Hearing, tympanic membrane, ear canal and external ear normal.     Left Ear: Hearing, tympanic membrane, ear canal and external ear normal.     Mouth/Throat:     Pharynx: Oropharynx is clear.  Eyes:     Extraocular Movements: Extraocular movements intact.     Pupils: Pupils are equal, round, and reactive to light.  Neck:     Thyroid: No thyroid mass, thyromegaly or thyroid tenderness.     Vascular: No carotid bruit.  Cardiovascular:     Rate and Rhythm: Normal rate and regular rhythm. No extrasystoles are present.    Pulses:          Dorsalis pedis pulses are 1+ on the right side and 1+ on the left side.     Heart sounds: Normal heart sounds. No murmur heard.    No friction rub. No gallop.  Pulmonary:     Effort: Pulmonary effort is normal.     Breath sounds: Normal breath sounds. No decreased breath sounds, wheezing, rhonchi or rales.  Chest:     Chest wall: No mass.  Abdominal:     Palpations: Abdomen is soft. There is no hepatomegaly, splenomegaly or mass.     Tenderness: There is no abdominal tenderness.     Hernia: No hernia is present.  Musculoskeletal:     Cervical back: Normal range of motion.     Right lower leg: No edema.     Left lower leg: No edema.  Lymphadenopathy:     Cervical: No cervical adenopathy.     Upper Body:     Right upper body: No supraclavicular adenopathy.     Left upper body: No supraclavicular adenopathy.  Skin:    General: Skin is warm and dry.  Neurological:     General: No focal deficit present.     Mental Status: She is alert and oriented to person, place, and time. Mental status is at baseline.     Sensory: Sensation is intact.     Motor: Motor function is intact. No  weakness.     Deep Tendon Reflexes: Reflexes are normal and symmetric.  Psychiatric:        Attention and Perception: Attention normal.        Mood and Affect: Mood normal.        Speech: Speech normal.        Behavior: Behavior normal.        Thought Content: Thought content normal.        Cognition and  Memory: Cognition normal.        Judgment: Judgment normal.       Results:   Studies obtained and personally reviewed by me:  Mammogram last completed 10/24/22. No mammographic evidence of malignancy. Recommended repeat in 2025.  Last colonoscopy was in 2017 and she was reminded about this today.  Had 1 sessile serrated adenoma removed.  Labs:       Component Value Date/Time   NA 143 11/03/2022 1213   NA 142 07/21/2022 1106   K 5.4 (H) 11/03/2022 1213   CL 106 11/03/2022 1213   CO2 29 11/03/2022 1213   GLUCOSE 93 11/03/2022 1213   BUN 20 11/03/2022 1213   BUN 13 07/21/2022 1106   CREATININE 0.87 11/03/2022 1213   CALCIUM 9.8 11/03/2022 1213   PROT 6.9 11/03/2022 1213   PROT 6.3 07/21/2022 1106   ALBUMIN 4.3 07/21/2022 1106   AST 16 11/03/2022 1213   ALT 16 11/03/2022 1213   ALKPHOS 115 07/21/2022 1106   BILITOT 0.5 11/03/2022 1213   BILITOT 0.4 07/21/2022 1106   GFRNONAA 88 11/25/2020 1234   GFRAA 102 11/25/2020 1234     Lab Results  Component Value Date   WBC 6.9 11/03/2022   HGB 14.9 11/03/2022   HCT 44.0 11/03/2022   MCV 90.0 11/03/2022   PLT 282 11/03/2022    Lab Results  Component Value Date   CHOL 189 12/01/2022   HDL 57 12/01/2022   LDLCALC 115 (H) 12/01/2022   TRIG 77 12/01/2022   CHOLHDL 3.3 12/01/2022    Lab Results  Component Value Date   HGBA1C 5.6 12/01/2022     Lab Results  Component Value Date   TSH 0.63 11/03/2022      Assessment & Plan:   IBS: treated with dicyclomine 20 mg every six hours.  Hyperlipidemia: LDL elevated at 115. Does not tolerate statins, Repatha. She will work on controlling with diet and  exercise.  Unintentional weight loss: lost 14 pounds between 12/01/21 and today. Reports appetite has been diminished. No increase in stress. RUQ abdomen US scheduled for 12/12/22.  Pelvic and breast exams deferred.  Mammogram: last completed 10/24/22. No mammographic evidence of malignancy. Recommended repeat in 2025.  Last colonoscopy was in 2017.  Had 1 sessile serrated adenoma removed.  Reports she has scheduled a repeat.  T-score -1.5 at left femoral neck on 10/24/22. Advised to take Vitamin D 2,000 units daily.   Vaccine counseling: UTD on tetanus vaccine. Other vaccines discussed.  Return in 3 months for follow up with irritable bowel issues. Declines statin medication.     I,Alexander Ruley,acting as a Neurosurgeon for Margaree Mackintosh, MD.,have documented all relevant documentation on the behalf of Margaree Mackintosh, MD,as directed by  Margaree Mackintosh, MD while in the presence of Margaree Mackintosh, MD.   I, Margaree Mackintosh, MD, have reviewed all documentation for this visit. The documentation on 12/16/22 for the exam, diagnosis, procedures, and orders are all accurate and complete.

## 2022-11-29 ENCOUNTER — Telehealth: Payer: Self-pay | Admitting: Internal Medicine

## 2022-11-29 ENCOUNTER — Encounter: Payer: Self-pay | Admitting: Internal Medicine

## 2022-11-29 NOTE — Telephone Encounter (Signed)
Hi Dr. Leone Payor,    This patient is requesting a transfer of care over to Dr. Leonides Schanz due to preferring a female provider. Would you approve this request?     Thank you.

## 2022-11-29 NOTE — Telephone Encounter (Signed)
certainly 

## 2022-11-29 NOTE — Telephone Encounter (Signed)
Hi Dr. Leonides Schanz,     Would you approve of the request below?    Thank you.

## 2022-12-01 ENCOUNTER — Other Ambulatory Visit: Payer: BC Managed Care – PPO

## 2022-12-01 DIAGNOSIS — Z6841 Body Mass Index (BMI) 40.0 and over, adult: Secondary | ICD-10-CM

## 2022-12-01 DIAGNOSIS — E782 Mixed hyperlipidemia: Secondary | ICD-10-CM

## 2022-12-02 LAB — LIPID PANEL
Non-HDL Cholesterol (Calc): 132 mg/dL (calc) — ABNORMAL HIGH (ref <130)
Triglycerides: 77 mg/dL (ref <150)

## 2022-12-04 ENCOUNTER — Ambulatory Visit (INDEPENDENT_AMBULATORY_CARE_PROVIDER_SITE_OTHER): Payer: BC Managed Care – PPO | Admitting: Internal Medicine

## 2022-12-04 ENCOUNTER — Encounter: Payer: Self-pay | Admitting: Internal Medicine

## 2022-12-04 VITALS — BP 122/70 | HR 89 | Temp 98.2°F | Resp 16 | Ht 62.0 in | Wt 213.0 lb

## 2022-12-04 DIAGNOSIS — Z87442 Personal history of urinary calculi: Secondary | ICD-10-CM | POA: Diagnosis not present

## 2022-12-04 DIAGNOSIS — Z8349 Family history of other endocrine, nutritional and metabolic diseases: Secondary | ICD-10-CM

## 2022-12-04 DIAGNOSIS — R319 Hematuria, unspecified: Secondary | ICD-10-CM | POA: Diagnosis not present

## 2022-12-04 DIAGNOSIS — M1712 Unilateral primary osteoarthritis, left knee: Secondary | ICD-10-CM

## 2022-12-04 DIAGNOSIS — Z0000 Encounter for general adult medical examination without abnormal findings: Secondary | ICD-10-CM | POA: Diagnosis not present

## 2022-12-04 DIAGNOSIS — E782 Mixed hyperlipidemia: Secondary | ICD-10-CM

## 2022-12-04 DIAGNOSIS — M2550 Pain in unspecified joint: Secondary | ICD-10-CM

## 2022-12-04 DIAGNOSIS — Z8542 Personal history of malignant neoplasm of other parts of uterus: Secondary | ICD-10-CM

## 2022-12-04 DIAGNOSIS — K580 Irritable bowel syndrome with diarrhea: Secondary | ICD-10-CM

## 2022-12-04 LAB — POCT URINALYSIS DIPSTICK
Bilirubin, UA: NEGATIVE
Glucose, UA: NEGATIVE
Ketones, UA: NEGATIVE
Leukocytes, UA: NEGATIVE
Nitrite, UA: NEGATIVE
Protein, UA: NEGATIVE
Spec Grav, UA: 1.02 (ref 1.010–1.025)
Urobilinogen, UA: 0.2 E.U./dL
pH, UA: 5 (ref 5.0–8.0)

## 2022-12-04 NOTE — Patient Instructions (Addendum)
Follow up in 3 months. Trial of dicyclomine for irritable bowel syndrome. Vaccines discussed.

## 2022-12-05 LAB — URINALYSIS, COMPLETE
Glucose, UA: NEGATIVE
Ketones, ur: NEGATIVE
RBC / HPF: NONE SEEN /HPF (ref 0–2)

## 2022-12-05 LAB — URINE CULTURE
MICRO NUMBER:: 15090568
Result:: NO GROWTH
SPECIMEN QUALITY:: ADEQUATE

## 2022-12-07 LAB — URINALYSIS, COMPLETE
Bilirubin Urine: NEGATIVE
Hgb urine dipstick: NEGATIVE
Leukocytes,Ua: NEGATIVE
Nitrite: NEGATIVE
Protein, ur: NEGATIVE
Specific Gravity, Urine: 1.024 (ref 1.001–1.035)
pH: <=5 (ref 5.0–8.0)

## 2022-12-07 LAB — URINE CULTURE

## 2022-12-07 LAB — URIC ACID

## 2022-12-08 LAB — LIPID PANEL
Cholesterol: 189 mg/dL (ref <200)
HDL: 57 mg/dL (ref >=50)
LDL Cholesterol (Calc): 115 mg/dL (calc) — ABNORMAL HIGH
Total CHOL/HDL Ratio: 3.3 (calc) (ref <5.0)

## 2022-12-08 LAB — HEMOGLOBIN A1C
Hgb A1c MFr Bld: 5.6 % of total Hgb (ref <5.7)
Mean Plasma Glucose: 114 mg/dL
eAG (mmol/L): 6.3 mmol/L

## 2022-12-08 LAB — URIC ACID

## 2022-12-12 ENCOUNTER — Other Ambulatory Visit: Payer: BC Managed Care – PPO

## 2022-12-12 ENCOUNTER — Ambulatory Visit
Admission: RE | Admit: 2022-12-12 | Discharge: 2022-12-12 | Disposition: A | Discharge status: Home / Self Care | Payer: BC Managed Care – PPO | Source: Ambulatory Visit | Attending: Internal Medicine | Admitting: Internal Medicine

## 2022-12-12 DIAGNOSIS — N200 Calculus of kidney: Secondary | ICD-10-CM

## 2022-12-13 LAB — URIC ACID: Uric Acid, Serum: 7.2 mg/dL — ABNORMAL HIGH (ref 2.5–7.0)

## 2022-12-25 ENCOUNTER — Ambulatory Visit (AMBULATORY_SURGERY_CENTER): Payer: BC Managed Care – PPO

## 2022-12-25 ENCOUNTER — Other Ambulatory Visit: Payer: Self-pay

## 2022-12-25 VITALS — Ht 62.0 in | Wt 209.0 lb

## 2022-12-25 DIAGNOSIS — Z1211 Encounter for screening for malignant neoplasm of colon: Secondary | ICD-10-CM

## 2022-12-25 MED ORDER — NA SULFATE-K SULFATE-MG SULF 17.5-3.13-1.6 GM/177ML PO SOLN
1.00 | Freq: Once | ORAL | 0 refills | Status: AC
Start: 2022-12-25 — End: 2022-12-25

## 2022-12-25 NOTE — Progress Notes (Signed)
Denies allergies to eggs or soy products. Denies complication of anesthesia or sedation. Denies use of weight loss medication. Denies use of O2.   Emmi instructions given for colonoscopy.  

## 2022-12-29 ENCOUNTER — Encounter: Payer: Self-pay | Admitting: Internal Medicine

## 2023-01-04 ENCOUNTER — Telehealth: Payer: Self-pay | Admitting: Internal Medicine

## 2023-01-04 ENCOUNTER — Encounter: Payer: Self-pay | Admitting: Certified Registered Nurse Anesthetist

## 2023-01-04 NOTE — Telephone Encounter (Signed)
Inbound call from patient stating she has been eating watermelon with seeds all week and last ate some yesterday at lunch time. Patient is scheduled for colonoscopy tomorrow 7/19 and starts prep tonight. Patient is requesting a call back to make sure she is okay to proceed with procedure. Please advise, thank you.

## 2023-01-04 NOTE — Telephone Encounter (Signed)
Returned pt's call. Instructed her to drink as much fluids as possible to help flush colon. She states the seeds were the small white ones found in seedless watermelons. Pt verbalized understanding. Plan to proceed with procedure.

## 2023-01-05 ENCOUNTER — Ambulatory Visit (AMBULATORY_SURGERY_CENTER): Payer: BC Managed Care – PPO | Admitting: Internal Medicine

## 2023-01-05 ENCOUNTER — Encounter: Payer: Self-pay | Admitting: Internal Medicine

## 2023-01-05 VITALS — BP 140/76 | HR 69 | Temp 98.6°F | Resp 15 | Ht 62.0 in | Wt 209.0 lb

## 2023-01-05 DIAGNOSIS — D125 Benign neoplasm of sigmoid colon: Secondary | ICD-10-CM | POA: Diagnosis not present

## 2023-01-05 DIAGNOSIS — Z86010 Personal history of colonic polyps: Secondary | ICD-10-CM

## 2023-01-05 DIAGNOSIS — Z09 Encounter for follow-up examination after completed treatment for conditions other than malignant neoplasm: Secondary | ICD-10-CM | POA: Diagnosis present

## 2023-01-05 DIAGNOSIS — K633 Ulcer of intestine: Secondary | ICD-10-CM | POA: Diagnosis not present

## 2023-01-05 DIAGNOSIS — K529 Noninfective gastroenteritis and colitis, unspecified: Secondary | ICD-10-CM

## 2023-01-05 DIAGNOSIS — K639 Disease of intestine, unspecified: Secondary | ICD-10-CM

## 2023-01-05 DIAGNOSIS — D122 Benign neoplasm of ascending colon: Secondary | ICD-10-CM | POA: Diagnosis not present

## 2023-01-05 DIAGNOSIS — K635 Polyp of colon: Secondary | ICD-10-CM

## 2023-01-05 MED ORDER — SODIUM CHLORIDE 0.9 % IV SOLN
500.0000 mL | Freq: Once | INTRAVENOUS | Status: DC
Start: 2023-01-05 — End: 2023-01-05

## 2023-01-05 NOTE — Patient Instructions (Signed)
Resume all of your previous medications today.  Read all of the handouts given to you by your recovery room nurse.  YOU HAD AN ENDOSCOPIC PROCEDURE TODAY AT THE [facility]:   Refer to the procedure report that was given to you for any specific questions about what was found during the examination.  If the procedure report does not answer your questions, please call your gastroenterologist to clarify.  If you requested that your care partner not be given the details of your procedure findings, then the procedure report has been included in a sealed envelope for you to review at your convenience later.  YOU SHOULD EXPECT: Some feelings of bloating in the abdomen. Passage of more gas than usual.  Walking can help get rid of the air that was put into your GI tract during the procedure and reduce the bloating. If you had a lower endoscopy (such as a colonoscopy or flexible sigmoidoscopy) you may notice spotting of blood in your stool or on the toilet paper. If you underwent a bowel prep for your procedure, you may not have a normal bowel movement for a few days.  Please Note:  You might notice some irritation and congestion in your nose or some drainage.  This is from the oxygen used during your procedure.  There is no need for concern and it should clear up in a day or so.  SYMPTOMS TO REPORT IMMEDIATELY:  Following lower endoscopy (colonoscopy or flexible sigmoidoscopy):  Excessive amounts of blood in the stool  Significant tenderness or worsening of abdominal pains  Swelling of the abdomen that is new, acute  Fever of 100F or higher   For urgent or emergent issues, a gastroenterologist can be reached at any hour by calling [phone]. Do not use MyChart messaging for urgent concerns.    DIET:  We do recommend a small meal at first, but then you may proceed to your regular diet.  Drink plenty of fluids but you should avoid alcoholic beverages for 24 hours.  ACTIVITY:  You  should plan to take it easy for the rest of today and you should NOT DRIVE or use heavy machinery until tomorrow (because of the sedation medicines used during the test).    FOLLOW UP: Our staff will call the number listed on your records the next business day following your procedure.  We will call around 7:15- 8:00 am to check on you and address any questions or concerns that you may have regarding the information given to you following your procedure. If we do not reach you, we will leave a message.     If any biopsies were taken you will be contacted by phone or by letter within the next 1-3 weeks.  Please call us at [phone] if you have not heard about the biopsies in 3 weeks.    SIGNATURES/CONFIDENTIALITY: You and/or your care partner have signed paperwork which will be entered into your electronic medical record.  These signatures attest to the fact that that the information above on your After Visit Summary has been reviewed and is understood.  Full responsibility of the confidentiality of this discharge information lies with you and/or your care-partner. 

## 2023-01-05 NOTE — Progress Notes (Signed)
Data will not transfer from monitor, vs being posted manually.   

## 2023-01-05 NOTE — Progress Notes (Signed)
Report given to PACU, vss 

## 2023-01-05 NOTE — Progress Notes (Signed)
Called to room to assist during endoscopic procedure.  Patient ID and intended procedure confirmed with present staff. Received instructions for my participation in the procedure from the performing physician.  

## 2023-01-05 NOTE — Progress Notes (Signed)
Brief Colonoscopy Summary (Provation app was down when procedure was performed): Good prep. Normal terminal ileum. Normal appendiceal orifice and ileocecal valve. Scattered erosions in the cecum and ascending colon that was were biopsied. Three 3-15 mm sessile polyps in the ascending colon were fully removed with cold snare. Diverticulosis in the ascending, transverse, descending, and sigmoid colon. Localized erythema in the sigmoid colon that was biopsied. One 3 mm sessile polyp in the sigmoid colon that was fully removed with cold snare. Internal hemorrhoids seen on rectal retroflexion.

## 2023-01-05 NOTE — Progress Notes (Signed)
GASTROENTEROLOGY PROCEDURE H&P NOTE   Primary Care Physician: Margaree Mackintosh, MD    Reason for Procedure:   History of colon polyps  Plan:    Colonoscopy  Patient is appropriate for endoscopic procedure(s) in the ambulatory ([facility]) setting.  The nature of the procedure, as well as the risks, benefits, and alternatives were carefully and thoroughly reviewed with the patient. Ample time for discussion and questions allowed. The patient understood, was satisfied, and agreed to proceed.     HPI: Audrey Yoder is a 72 y.o. female who presents for colonoscopy for history of colon polyps. Last colonoscopy in 2017 showed a 10 cm SSA in the cecum resected with cold snare. She has intermittently experienced some bleeding. Lost some weight over the last year unintentionally. She has also noted that her stools are slightly more frequent than usual (she goes throughout the day instead of just once per day). Denies family history of colon cancer.  Past Medical History:  Diagnosis Date   Allergy    Arthritis    CAD in native artery 03/14/2022   Elevated blood pressure reading 03/14/2022   Endometrial ca [facility])    Hx of colonic polyps 03/31/2016    Past Surgical History:  Procedure Laterality Date   ABDOMINAL HYSTERECTOMY     TOTAL VAGINAL HYSTERECTOMY      Prior to Admission medications   Medication Sig Start Date End Date Taking? Authorizing Provider  cholecalciferol (VITAMIN D3) 25 MCG (1000 UNIT) tablet Take 1,000 Units by mouth daily.    [provider]  dicyclomine (BENTYL) 20 MG tablet Take 1 tablet (20 mg total) by mouth every 6 (six) hours. Patient not taking: Reported on 12/04/2022 11/03/22   Margaree Mackintosh, MD  Multiple Vitamin (MULTIVITAMIN) tablet Take 1 tablet by mouth daily.    [provider]    Current Outpatient Medications  Medication Sig Dispense Refill   cholecalciferol (VITAMIN D3) 25 MCG (1000 UNIT) tablet Take 1,000 Units by mouth daily.      Multiple Vitamin (MULTIVITAMIN) tablet Take 1 tablet by mouth daily.     dicyclomine (BENTYL) 20 MG tablet Take 1 tablet (20 mg total) by mouth every 6 (six) hours. (Patient not taking: Reported on 12/04/2022) 30 tablet 1   Current Facility-Administered Medications  Medication Dose Route Frequency Provider Last Rate Last Admin   0.9 %  sodium chloride infusion  500 mL Intravenous Once Imogene Burn, MD        Allergies as of 01/05/2023 - Review Complete 01/05/2023  Allergen Reaction Noted   Sulfa antibiotics Rash 12/28/2010   Levofloxacin  09/19/2011   Repatha [evolocumab] Other (See Comments) 12/04/2022   Statins  11/03/2022    Family History  Problem Relation Age of Onset   Stroke Mother 11   Other Father        Miloprolific disease   Other Brother        Miloprolific disease   Stroke Maternal Uncle    COPD Paternal Grandfather    Colon cancer Neg Hx    Esophageal cancer Neg Hx    Rectal cancer Neg Hx    Stomach cancer Neg Hx     Social History   Socioeconomic History   Marital status: Married    Spouse name: Not on file   Number of children: Not on file   Years of education: Not on file   Highest education level: Not on file  Occupational History   Not on file  Tobacco Use   Smoking status: Never   Smokeless tobacco: Never  Vaping Use   Vaping status: Never Used  Substance and Sexual Activity   Alcohol use: Yes    Alcohol/week: 2.0 standard drinks of alcohol    Types: 2 Glasses of wine per week   Drug use: No   Sexual activity: Not on file  Other Topics Concern   Not on file  Social History Narrative   Married, is an elder Social worker and estate attorney   Social Determinants of Health   Financial Resource Strain: Not on file  Food Insecurity: No Food Insecurity (03/14/2022)   Hunger Vital Sign    Worried About Running Out of Food in the Last Year: Never true    Ran Out of Food in the Last Year: Never true  Transportation Needs: Not on file  Physical  Activity: Insufficiently Active (03/14/2022)   Exercise Vital Sign    Days of Exercise per Week: 7 days    Minutes of Exercise per Session: 20 min  Stress: Not on file  Social Connections: Not on file  Intimate Partner Violence: Not on file    Physical Exam: Vital signs in last 24 hours: BP (!) 171/94   Pulse 77   Temp 98.6 F (37 C) (Temporal)   Resp 20   Ht 5\' 2"  (1.575 m)   Wt 209 lb (94.8 kg)   SpO2 97%   BMI 38.23 kg/m  GEN: NAD EYE: Sclerae anicteric ENT: MMM CV: Non-tachycardic Pulm: No increased work of breathing GI: Soft, NT/ND NEURO:  Alert & Oriented   Eulah Pont, MD [facility]  01/05/2023 11:09 AM

## 2023-01-05 NOTE — Progress Notes (Signed)
Pt's states no medical or surgical changes since previsit or office visit. 

## 2023-01-08 ENCOUNTER — Telehealth: Payer: Self-pay

## 2023-01-08 NOTE — Telephone Encounter (Signed)
No answer, left message to call if having any issues or concerns, B.Schwartz RN 

## 2023-01-09 NOTE — Op Note (Signed)
[facility] Patient Name: Audrey Yoder Procedure Date: 01/09/2023 11:20 AM MRN: [id number] Endoscopist: Madelyn Brunner [place] , , [id number] Age: 72 Referring MD:  Date of Birth: 12-22-1950 Gender: Female Account #: [id number] Procedure:                Colonoscopy Indications:              High risk colon cancer surveillance: Personal                            history of colonic polyps Medicines:                Monitored Anesthesia Care Procedure:                Pre-Anesthesia Assessment:                           - Prior to the procedure, a History and Physical                            was performed, and patient medications and                            allergies were reviewed. The patient's tolerance of                            previous anesthesia was also reviewed. The risks                            and benefits of the procedure and the sedation                            options and risks were discussed with the patient.                            All questions were answered, and informed consent                            was obtained. Prior Anticoagulants: The patient has                            taken no anticoagulant or antiplatelet agents. ASA                            Grade Assessment: III - A patient with severe                            systemic disease. After reviewing the risks and                            benefits, the patient was deemed in satisfactory                            condition to undergo the procedure.  After obtaining informed consent, the colonoscope                            was passed under direct vision. Throughout the                            procedure, the patient's blood pressure, pulse, and                            oxygen saturations were monitored continuously. The                            Olympus Scope [id number] was introduced through the                            anus and advanced to  the the terminal ileum. The                            colonoscopy was performed without difficulty. The                            patient tolerated the procedure well. The quality                            of the bowel preparation was good. The terminal                            ileum, ileocecal valve, appendiceal orifice, and                            rectum were photographed. Findings:                 The terminal ileum appeared normal.                           Multiple scattered non-bleeding erosions were found                            in the ascending colon and in the cecum. No                            stigmata of recent bleeding were seen. Biopsies                            were taken with a cold forceps for histology.                           Three sessile polyps were found in the ascending                            colon. The polyps were 3 to 15 mm in size. These  polyps were removed with a cold snare. Resection                            and retrieval were complete.                           Multiple diverticula were found in the sigmoid                            colon, descending colon, transverse colon and                            ascending colon.                           A localized area of erythematous mucosa was found                            in the sigmoid colon. This was biopsied with a cold                            forceps for histology.                           A 3 mm polyp was found in the sigmoid colon. The                            polyp was sessile. The polyp was removed with a                            cold snare. Resection and retrieval were complete.                           Non-bleeding internal hemorrhoids were found during                            retroflexion. Complications:            No immediate complications. Estimated Blood Loss:     Estimated blood loss was minimal. Impression:               - The  examined portion of the ileum was normal.                           - Multiple erosions in the ascending colon and in                            the cecum. Biopsied.                           - Three 3 to 15 mm polyps in the ascending colon,                            removed with a cold snare. Resected and retrieved.                           -  Diverticulosis in the sigmoid colon, in the                            descending colon, in the transverse colon and in                            the ascending colon.                           - Erythematous mucosa in the sigmoid colon.                            Biopsied.                           - One 3 mm polyp in the sigmoid colon, removed with                            a cold snare. Resected and retrieved.                           - Non-bleeding internal hemorrhoids.                           - Scope in at 11:11 AM. Cecum time at 11:16 AM.                            Scope out at 11:35 AM.                           - No pictures were taken because Provation                            application was down during the procedure. Recommendation:           - Discharge patient to home (with escort).                           - Await pathology results.                           - The findings and recommendations were discussed                            with the patient. Dr Particia Lather "[place]" [place],  01/09/2023 11:24:14 AM

## 2023-01-10 ENCOUNTER — Encounter: Payer: Self-pay | Admitting: Internal Medicine

## 2023-02-26 NOTE — Progress Notes (Signed)
Patient Care Team: Margaree Mackintosh, MD as PCP - General (Internal Medicine) Chilton Si, MD as PCP - Cardiology (Cardiology)  Visit Date: 03/05/23  Subjective:    Patient ID: Audrey Yoder , Female   DOB: 01-Jan-1951, 72 y.o.    MRN: [id number]   72 y.o. Female presents today for a 3 month IBS follow-up. 01/05/23 colonoscopy showed scattered erosions in cecum and ascending colon, three 3-15 mm sessile polyps in ascending colon, diverticulosis in ascending, transverse, descending and sigmoid colon, localized erythema in sigmoid colon, one 3 mm sessile polyp in sigmoid colon, internal hemorrhoids. Pathology showed four sessile serrated polyps, inflammation that is favored to be due to NSAID use or infection rather than IBS. Repeat recommended in 2027.   She has not received a call from [facility] regarding these results. She was seen here for RLQ abdominal pain, nausea, diarrhea, bloating, decreased appetite on 11/03/22. She has not been taking dicyclomine. Her GI symptoms have improved. She is having more frequent hot flashes, reduced appetite, unintentional weight loss. She is having 5-6 hot flashes daily. She has lost 19 pounds since 6/23. She has been having a strong disinterest in food in the morning and says it does not feel like nausea. Her stress has remained stable. Denies diarrhea, vomiting. She is eating two meals a day, skipping breakfast in favor of a late-morning snack. She eats a lunch that usually consists of a salad with baked chicken and eats a full dinner. She gets occasional pain in her umbilicus area near a surgical incision site. Status post abdominal hysterectomy. HGBA1c at 5.6% three months ago. LDL elevated at 115 on 12/01/22.  Past Medical History:  Diagnosis Date   Allergy    Arthritis    CAD in native artery 03/14/2022   Elevated blood pressure reading 03/14/2022   Endometrial ca [facility])    Hx of colonic polyps 03/31/2016     Family History  Problem Relation  Age of Onset   Stroke Mother 64   Other Father        Miloprolific disease   Other Brother        Miloprolific disease   Stroke Maternal Uncle    COPD Paternal Grandfather    Colon cancer Neg Hx    Esophageal cancer Neg Hx    Rectal cancer Neg Hx    Stomach cancer Neg Hx     Social History   Social History Narrative   Married, is an elder Social worker and estate attorney      Review of Systems  Constitutional:  Negative for fever and malaise/fatigue.       (+) Hot flashes  HENT:  Negative for congestion.   Eyes:  Negative for blurred vision.  Respiratory:  Negative for cough and shortness of breath.   Cardiovascular:  Negative for chest pain, palpitations and leg swelling.  Gastrointestinal:  Positive for abdominal pain (Near umbilicus). Negative for diarrhea, nausea and vomiting.  Musculoskeletal:  Negative for back pain.  Skin:  Negative for rash.  Neurological:  Negative for loss of consciousness and headaches.        Objective:   Vitals: BP 120/70   Pulse 74   Ht 5\' 2"  (1.575 m)   Wt 208 lb (94.3 kg)   SpO2 98%   BMI 38.04 kg/m    Physical Exam Vitals and nursing note reviewed.  Constitutional:      General: She is not in acute distress.    Appearance: Normal appearance.  She is not toxic-appearing.  HENT:     Head: Normocephalic and atraumatic.  Pulmonary:     Effort: Pulmonary effort is normal.  Skin:    General: Skin is warm and dry.  Neurological:     Mental Status: She is alert and oriented to person, place, and time. Mental status is at baseline.  Psychiatric:        Mood and Affect: Mood normal.        Behavior: Behavior normal.        Thought Content: Thought content normal.        Judgment: Judgment normal.       Results:   Studies obtained and personally reviewed by me:  01/05/23 colonoscopy showed scattered erosions in cecum and ascending colon, three 3-15 mm sessile polyps in ascending colon, diverticulosis in ascending, transverse,  descending and sigmoid colon, localized erythema in sigmoid colon, one 3 mm sessile polyp in sigmoid colon, internal hemorrhoids. Pathology showed four sessile serrated polyps, inflammation that is favored to be due to NSAID use or infection rather than IBS.  Labs:       Component Value Date/Time   NA 143 11/03/2022 1213   NA 142 07/21/2022 1106   K 5.4 (H) 11/03/2022 1213   CL 106 11/03/2022 1213   CO2 29 11/03/2022 1213   GLUCOSE 93 11/03/2022 1213   BUN 20 11/03/2022 1213   BUN 13 07/21/2022 1106   CREATININE 0.87 11/03/2022 1213   CALCIUM 9.8 11/03/2022 1213   PROT 6.9 11/03/2022 1213   PROT 6.3 07/21/2022 1106   ALBUMIN 4.3 07/21/2022 1106   AST 16 11/03/2022 1213   ALT 16 11/03/2022 1213   ALKPHOS 115 07/21/2022 1106   BILITOT 0.5 11/03/2022 1213   BILITOT 0.4 07/21/2022 1106   GFRNONAA 88 11/25/2020 1234   GFRAA 102 11/25/2020 1234     Lab Results  Component Value Date   WBC 6.9 11/03/2022   HGB 14.9 11/03/2022   HCT 44.0 11/03/2022   MCV 90.0 11/03/2022   PLT 282 11/03/2022    Lab Results  Component Value Date   CHOL 189 12/01/2022   HDL 57 12/01/2022   LDLCALC 115 (H) 12/01/2022   TRIG 77 12/01/2022   CHOLHDL 3.3 12/01/2022    Lab Results  Component Value Date   HGBA1C 5.6 12/01/2022     Lab Results  Component Value Date   TSH 0.63 11/03/2022      Assessment & Plan:   Weight loss; decreased appetite: etiology unclear. Will refer back to [facility] for further evaluation.May need MRI of pancreas or abdominal CT. Symtpoms are vague. Weight loss is somewhat concerning. Need guidance from GI as to next steps.? CT abdomen.  ? Endoscopy or MRI of pancreas?    I,Alexander Ruley,acting as a Neurosurgeon for Margaree Mackintosh, MD.,have documented all relevant documentation on the behalf of Margaree Mackintosh, MD,as directed by  Margaree Mackintosh, MD while in the presence of Margaree Mackintosh, MD.   I, Margaree Mackintosh, MD, have reviewed all documentation for this visit. The  documentation on 03/06/23 for the exam, diagnosis, procedures, and orders are all accurate and complete.

## 2023-03-05 ENCOUNTER — Ambulatory Visit (INDEPENDENT_AMBULATORY_CARE_PROVIDER_SITE_OTHER): Payer: Medicare Other | Admitting: Internal Medicine

## 2023-03-05 ENCOUNTER — Encounter: Payer: Self-pay | Admitting: Internal Medicine

## 2023-03-05 VITALS — BP 120/70 | HR 74 | Ht 62.0 in | Wt 208.0 lb

## 2023-03-05 DIAGNOSIS — R634 Abnormal weight loss: Secondary | ICD-10-CM | POA: Diagnosis not present

## 2023-03-05 DIAGNOSIS — R1013 Epigastric pain: Secondary | ICD-10-CM | POA: Diagnosis not present

## 2023-03-06 ENCOUNTER — Encounter: Payer: Self-pay | Admitting: Internal Medicine

## 2023-03-06 NOTE — Patient Instructions (Signed)
Refer back to GI for persistent GI symptoms and 19 pound weight loss.

## 2023-03-12 ENCOUNTER — Ambulatory Visit (HOSPITAL_BASED_OUTPATIENT_CLINIC_OR_DEPARTMENT_OTHER): Payer: Medicare Other | Admitting: Cardiovascular Disease

## 2023-03-12 ENCOUNTER — Encounter (HOSPITAL_BASED_OUTPATIENT_CLINIC_OR_DEPARTMENT_OTHER): Payer: Self-pay | Admitting: Cardiovascular Disease

## 2023-03-12 VITALS — BP 142/70 | HR 73 | Ht 62.0 in | Wt 209.0 lb

## 2023-03-12 DIAGNOSIS — Z5181 Encounter for therapeutic drug level monitoring: Secondary | ICD-10-CM

## 2023-03-12 DIAGNOSIS — I2510 Atherosclerotic heart disease of native coronary artery without angina pectoris: Secondary | ICD-10-CM | POA: Diagnosis not present

## 2023-03-12 DIAGNOSIS — R030 Elevated blood-pressure reading, without diagnosis of hypertension: Secondary | ICD-10-CM | POA: Diagnosis not present

## 2023-03-12 DIAGNOSIS — E7800 Pure hypercholesterolemia, unspecified: Secondary | ICD-10-CM | POA: Diagnosis not present

## 2023-03-12 NOTE — Patient Instructions (Addendum)
Medication Instructions:  WILL START THE PROCESS FOR INCLISIRAN (LEQVIO) INJECTIONS  *If you need a refill on your cardiac medications before your next appointment, please call your pharmacy*   Lab Work: FASTING LP/CMET 3 MONTHS AFTER STARTING INCLISIRIN   If you have labs (blood work) drawn today and your tests are completely normal, you will receive your results only by: MyChart Message (if you have MyChart) OR A paper copy in the mail If you have any lab test that is abnormal or we need to change your treatment, we will call you to review the results.   Testing/Procedures: NONE    Follow-Up: At [facility], you and your health needs are our priority.  As part of our continuing mission to provide you with exceptional heart care, we have created designated Provider Care Teams.  These Care Teams include your primary Cardiologist (physician) and Advanced Practice Providers (APPs -  Physician Assistants and Nurse Practitioners) who all work together to provide you with the care you need, when you need it.  We recommend signing up for the patient portal called "MyChart".  Sign up information is provided on this After Visit Summary.  MyChart is used to connect with patients for Virtual Visits (Telemedicine).  Patients are able to view lab/test results, encounter notes, upcoming appointments, etc.  Non-urgent messages can be sent to your provider as well.   To learn more about what you can do with MyChart, go to [email or web address].    Your next appointment:   12 month(s)  Provider:   Chilton Si, MD or Gillian Shields, NP   Inclisiran Injection What is this medication? INCLISIRAN (in kli SIR an) treats high cholesterol. It works by decreasing bad cholesterol (such as LDL) in your blood. Changes to diet and exercise are often combined with this medication. This medicine may be used for other purposes; ask your health care provider or pharmacist if you have  questions. COMMON BRAND NAME(S): LEQVIO What should I tell my care team before I take this medication? They need to know if you have any of these conditions: An unusual or allergic reaction to inclisiran, other medications, foods, dyes, or preservatives Pregnant or trying to get pregnant Breast-feeding How should I use this medication? This medication is injected under the skin. It is given by your care team in a hospital or clinic setting. Talk to your care team about the use of this medication in children. Special care may be needed. Overdosage: If you think you have taken too much of this medicine contact a poison control center or emergency room at once. NOTE: This medicine is only for you. Do not share this medicine with others. What if I miss a dose? Keep appointments for follow-up doses. It is important not to miss your dose. Call your care team if you are unable to keep an appointment. What may interact with this medication? Interactions are not expected. This list may not describe all possible interactions. Give your health care provider a list of all the medicines, herbs, non-prescription drugs, or dietary supplements you use. Also tell them if you smoke, drink alcohol, or use illegal drugs. Some items may interact with your medicine. What should I watch for while using this medication? Visit your care team for regular checks on your progress. Tell your care team if your symptoms do not start to get better or if they get worse. You may need blood work while you are taking this medication. What side effects may I notice from  receiving this medication? Side effects that you should report to your care team as soon as possible: Allergic reactions--skin rash, itching, hives, swelling of the face, lips, tongue, or throat Side effects that usually do not require medical attention (report these to your care team if they continue or are bothersome): Joint pain Pain, redness, or irritation  at injection site This list may not describe all possible side effects. Call your doctor for medical advice about side effects. You may report side effects to FDA at 1-800-FDA-1088. Where should I keep my medication? This medication is given in a hospital or clinic. It will not be stored at home. NOTE: This sheet is a summary. It may not cover all possible information. If you have questions about this medicine, talk to your doctor, pharmacist, or health care provider.  2024 Elsevier/Gold Standard (2021-12-30 00:00:00)

## 2023-03-12 NOTE — Progress Notes (Signed)
Cardiology Office Note:  .   Date:  03/12/2023  ID:  Audrey Yoder, DOB Feb 20, 1951, MRN [id number] PCP: Audrey Mackintosh, MD  Audrey Yoder Cardiologist:  Audrey Si, MD    History of Present Illness: .   Audrey Yoder is a 72 y.o. female with of hyperlipidemia, nonobstructive CAD, obesity, and uterine cancer s/p hysterectomy and lymph node dissection here for follow-up.  She was initially seen 02/2022 for hyperlipidemia.  She last saw her PCP and her LDL had improved from 123 to 94.  She attributes this to increased exercise.  She had a calcium score 01/2022 which revealed a score of 308, which was 84th percentile for age and gender.   Visit 02/2022 her blood pressure was elevated, but she reported being under a lot of stress. She started rosuvastatin and planned to work on lifestyle changes.  She did not tolerate rosuvastatin or pravastatin so she was started on Repatha.  She did not tolerate Repatha and declined Zetia or bempedoic acid.  She followed up with our pharmacist 10/2022 and blood pressure was again elevated in the office.  She declined starting blood pressure medication.  It is recommended that she work on diet and exercise.  Audrey Yoder notes a decreased appetite and a 20-pound weight loss over the past year. She reports no other symptoms and is currently under the care of her primary care physician for this issue. She maintains a regular exercise routine, using her treadmill daily for 10-minute intervals, and denies any breathlessness or chest pain during or after exercise. She also denies any swelling in her legs or feet.  She has a history of muscle pain and weakness in her right leg associated with statin use, which led to two falls. She tried Repatha, but experienced similar symptoms, including leg pain and weakness. Since discontinuing the medication, she reports occasional weakness in her right leg, but no muscle pain. She has seen an orthopedist for this  issue and completed physical therapy, which she found to be similar to exercises she was already doing.  Her diet includes cheese, which she acknowledges could be contributing to her high cholesterol levels. She is aware of the need to increase her vegetable intake and limit animal products.     ROS:  As per HPI  Studies Reviewed: Marland Kitchen       Calcium score 01/2022: IMPRESSION: Coronary calcium score of 308. This was 84th percentile for age-, race-, and sex-matched controls.  EKG 03/12/23: Sinus rhythm.  Rate 73 bpm.  Risk Assessment/Calculations:     HYPERTENSION CONTROL Vitals:   03/12/23 0825 03/12/23 0850  BP: (!) 140/80 (!) 142/70    The patient's blood pressure is elevated above target today.  In order to address the patient's elevated BP: The blood pressure is usually elevated in clinic.  Blood pressures monitored at home have been optimal.; Blood pressure will be monitored at home to determine if medication changes need to be made.          Physical Exam:   VS:  BP (!) 142/70   Pulse 73   Ht 5\' 2"  (1.575 m)   Wt 209 lb (94.8 kg)   BMI 38.23 kg/m  , BMI Body mass index is 38.23 kg/m. GENERAL:  Well appearing HEENT: Pupils equal round and reactive, fundi not visualized, oral mucosa unremarkable NECK:  No jugular venous distention, waveform within normal limits, carotid upstroke brisk and symmetric, no bruits, no thyromegaly LUNGS:  Clear to auscultation  bilaterally HEART:  RRR.  PMI not displaced or sustained,S1 and S2 within normal limits, no S3, no S4, no clicks, no rubs, no murmurs ABD:  Flat, positive bowel sounds normal in frequency in pitch, no bruits, no rebound, no guarding, no midline pulsatile mass, no hepatomegaly, no splenomegaly EXT:  2 plus pulses throughout, no edema, no cyanosis no clubbing SKIN:  No rashes no nodules NEURO:  Cranial nerves II through XII grossly intact, motor grossly intact throughout PSYCH:  Cognitively intact, oriented to person place  and time  ASSESSMENT AND PLAN: .    # Hyperlipidemia: # Non-obstructive CAD:  Elevated cholesterol levels despite previous trials of statins and Repatha, which were discontinued due to muscle pain and weakness. Discussed the importance of diet and exercise in managing cholesterol levels. Also discussed the option of Inclisiran, an injectable medication administered every six months. -Initiate insurance process for [employer]. -Provide patient with information about Inclisiran for her to review. -Plan to recheck cholesterol levels three months after starting Inclisiran.  # Unexplained weight loss and decreased appetite Currently being investigated by the patient's primary care physician. No specific cardiovascular symptoms noted. -Continue follow-up with primary care physician as planned.  # Exercise tolerance Able to exercise on treadmill daily without experiencing breathlessness or chest pain. Reports occasional weakness in right leg, but no consistent pattern. -Encourage continued daily exercise.  # Elevated BP readings: Home readings generally better than office readings. Home BP averages 120s/70s.  Recent reading at primary care physician's office was 120/70. -Continue current management and monitor blood pressure at home.       Dispo: f/u 1 year  Signed, Audrey Si, MD

## 2023-03-28 ENCOUNTER — Other Ambulatory Visit: Payer: Self-pay | Admitting: Pharmacist Clinician (PhC)/ Clinical Pharmacy Specialist

## 2023-03-28 ENCOUNTER — Telehealth: Payer: Self-pay | Admitting: Pharmacy Technician

## 2023-03-28 NOTE — Progress Notes (Signed)
Leqvio order placed

## 2023-03-28 NOTE — Telephone Encounter (Signed)
Audrey Yoder note: patient will be scheduled as soon as possible.  Auth Submission: NO AUTH NEEDED Site of care: Site of care: [facility] Payer: MEDICARE A/B & BCBS SUPP Medication & CPT/J Code(s) submitted: Leqvio (Inclisiran) [phone] Route of submission (phone, fax, portal):  Phone # Fax # Auth type: Buy/Bill HB Units/visits requested: X3 Reference number:  Approval from: 03/28/23 to 06/19/23

## 2023-04-04 ENCOUNTER — Encounter: Payer: Self-pay | Admitting: Cardiovascular Disease

## 2023-08-01 ENCOUNTER — Encounter: Payer: Self-pay | Admitting: Internal Medicine

## 2023-08-01 ENCOUNTER — Other Ambulatory Visit (INDEPENDENT_AMBULATORY_CARE_PROVIDER_SITE_OTHER): Payer: Medicare Other

## 2023-08-01 ENCOUNTER — Ambulatory Visit (INDEPENDENT_AMBULATORY_CARE_PROVIDER_SITE_OTHER): Payer: Medicare Other | Admitting: Internal Medicine

## 2023-08-01 VITALS — BP 160/82 | HR 84 | Ht 62.0 in | Wt 211.0 lb

## 2023-08-01 DIAGNOSIS — R110 Nausea: Secondary | ICD-10-CM

## 2023-08-01 DIAGNOSIS — R197 Diarrhea, unspecified: Secondary | ICD-10-CM | POA: Diagnosis not present

## 2023-08-01 DIAGNOSIS — R634 Abnormal weight loss: Secondary | ICD-10-CM

## 2023-08-01 DIAGNOSIS — R630 Anorexia: Secondary | ICD-10-CM | POA: Diagnosis not present

## 2023-08-01 DIAGNOSIS — K219 Gastro-esophageal reflux disease without esophagitis: Secondary | ICD-10-CM

## 2023-08-01 DIAGNOSIS — K633 Ulcer of intestine: Secondary | ICD-10-CM

## 2023-08-01 DIAGNOSIS — R195 Other fecal abnormalities: Secondary | ICD-10-CM

## 2023-08-01 DIAGNOSIS — Z860100 Personal history of colon polyps, unspecified: Secondary | ICD-10-CM

## 2023-08-01 DIAGNOSIS — R109 Unspecified abdominal pain: Secondary | ICD-10-CM

## 2023-08-01 LAB — C-REACTIVE PROTEIN: CRP: <1 mg/dL (ref 0.5–20.0)

## 2023-08-01 NOTE — Progress Notes (Unsigned)
Chief Complaint: Weight loss  HPI : 73 year old female with history of CAD, arthritis, endometrial cancer in remission, and colon polyps presents with weight loss  Over a year ago, she started dealing with poor appetite and weight loss. Her appetite got a little better around Christmas but then it got worse again. Thinking about food made her nauseous. She has had a lot of gas recently. She used have regular bowel habits, but this also changed about a year. She was having multiple BMs that were looser. She is having 1-3 BMs per day. Denies nocturnal stools. She finished her endometrial cancer treatment in 2011 and she said she was in remission. Lost 23 lbs over the last year. She had one episode of rectal bleeding. Denies vomiting. She has one episode of acid reflux once every 3-4 months. Denies dysphagia. She will have ab pain that is usually to the right of the belly button. 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 She was not on NSAIDs prior to her most recent colonoscopy. She suspected that her mother had IBS. Denies bloating.   Wt Readings from Last 3 Encounters:  08/01/23 211 lb (95.7 kg)  03/12/23 209 lb (94.8 kg)  03/05/23 208 lb (94.3 kg)   Past Medical History:  Diagnosis Date   Allergy    Arthritis    CAD in native artery 03/14/2022   Elevated blood pressure reading 03/14/2022   Endometrial ca [facility])    Hx of colonic polyps 03/31/2016   Past Surgical History:  Procedure Laterality Date   ABDOMINAL HYSTERECTOMY     TOTAL VAGINAL HYSTERECTOMY     Family History  Problem Relation Age of Onset   Stroke Mother 57   Other Father        Miloprolific disease   Other Brother        Miloprolific disease   Stroke Maternal Uncle    COPD Paternal Grandfather    Colon cancer Neg Hx    Esophageal cancer Neg Hx    Rectal cancer Neg Hx    Stomach cancer Neg Hx    Social History   Tobacco Use   Smoking status: Never   Smokeless tobacco: Never   Vaping Use   Vaping status: Never Used  Substance Use Topics   Alcohol use: Yes    Alcohol/week: 2.0 standard drinks of alcohol    Types: 2 Glasses of wine per week   Drug use: No   Current Outpatient Medications  Medication Sig Dispense Refill   cholecalciferol (VITAMIN D3) 25 MCG (1000 UNIT) tablet Take 1,000 Units by mouth daily.     Multiple Vitamin (MULTIVITAMIN) tablet Take 1 tablet by mouth daily.     No current facility-administered medications for this visit.   Allergies  Allergen Reactions   Sulfa Antibiotics Rash   Levofloxacin     Joint pain   Repatha [Evolocumab] Other (See Comments)    Doesn't work/ severe muscle pain    Statins     Muscle pain   Review of Systems: All systems reviewed and negative except where noted in HPI.   Physical Exam: BP (!) 160/82   Pulse 84   Ht 5\' 2"  (1.575 m)   Wt 211 lb (95.7 kg)   BMI 38.59 kg/m  Constitutional: Pleasant,well-developed, ***female in no acute distress. HEENT: Normocephalic and atraumatic. Conjunctivae are normal. No scleral icterus. Cardiovascular: Normal rate, regular rhythm.  Pulmonary/chest: Effort normal and breath sounds normal. No  wheezing, rales or rhonchi. Abdominal: Soft, nondistended, nontender. Bowel sounds active throughout. There are no masses palpable. No hepatomegaly. Extremities: No edema Neurological: Alert and oriented to person place and time. Skin: Skin is warm and dry. No rashes noted. Psychiatric: Normal mood and affect. Behavior is normal.  Labs 10/2022: CBC nml. CMP with elevated potassium of 5.4. Lipase nml. TSH nml. Free T4 nml. CA125 nml.   Labs 06/2023: CRP was elevated (patient could not recall exactly how high)  RUQ U/S 12/12/22: IMPRESSION: 1. Diffuse increased echogenicity throughout the liver is nonspecific and may be seen with hepatic steatosis or other underlying intrinsic liver disease. 2. No other abnormalities.  Colonoscopy 03/23/16: - One 10 mm polyp in the cecum,  removed piecemeal using a cold snare. Resected and retrieved. - Severe diverticulosis in the left colon. - The examination was otherwise normal on direct and retroflexion views. Path: Surgical [P], cecum, polyp -SESSILE SERRATED POLYP/ADENOMA. -NO HIGH GRADE DYSPLASIA OR MALIGNANCY IDENTIFIED.  Colonoscopy 01/09/23: - The terminal ileum appeared normal. - Multiple scattered non- bleeding erosions were found in the ascending colon and in the cecum. No stigmata of recent bleeding were seen. Biopsies were taken with a cold forceps for histology. - Three sessile polyps were found in the ascending colon. The polyps were 3 to 15 mm in size. These polyps were removed with a cold snare. Resection and retrieval were complete. - Multiple diverticula were found in the sigmoid colon, descending colon, transverse colon and ascending colon. - A localized area of erythematous mucosa was found in the sigmoid colon. This was biopsied with a cold forceps for histology. - A 3 mm polyp was found in the sigmoid colon. The polyp was sessile. The polyp was removed with a cold snare. Resection and retrieval were complete. - Non- bleeding internal hemorrhoids were found during retroflexion. Path: 1. Surgical [P], colon, ascending, sigmoid, polyp (4) - SESSILE SERRATED POLYP(S). - NO DYSPLASIA OR MALIGNANCY. - POLYPOID FRAGMENT OF BENIGN COLONIC MUCOSA WITH LYMPHOID AGGREGATE 2. Surgical [P], colon erosion bx - NONSPECIFIC CHRONIC AND FOCALLY ACTIVE COLITIS - Findings are nonspecific and diagnostic considerations include NSAIDs, infection, and less likely inflammatory bowel disease. Clinical and endoscopic correlation is recommended. - NEGATIVE FOR VIRAL CYTOPATHIC EFFECT OR GRANULOMAS - NEGATIVE FOR DYSPLASIA OR MALIGNANCY 3. Surgical [P], left colon bx - BENIGN COLONIC MUCOSA WITH MILD REACTIVE/REPARATIVE CHANGES - NEGATIVE FOR ACUTE/CHRONIC INFLAMMATION - NEGATIVE FOR DYSPLASIA OR MALIGNANCY  ASSESSMENT AND  PLAN: Weight loss Poor appetite Nausea Occasional GERD Right sided ab pain Right sided colon erosions Loose stools Fatty liver History of colon polyps - Low FODMAP diet - Check TTG IgA, IgA, CRP - Declined stool tests - Start antinausea medication or trial of GERD medication - EGD [facility] - Next colonoscopy due in 12/2025 for polyp surveillance - Could consider mesalamine or sulfasalazine  Eulah Pont, MD

## 2023-08-01 NOTE — Patient Instructions (Signed)
Your provider has requested that you go to the basement level for lab work before leaving today. Press "B" on the elevator. The lab is located at the first door on the left as you exit the elevator.  You have been scheduled for an endoscopy. Please follow written instructions given to you at your visit today.  If you use inhalers (even only as needed), please bring them with you on the day of your procedure.  If you take any of the following medications, they will need to be adjusted prior to your procedure:   DO NOT TAKE 7 DAYS PRIOR TO TEST- Trulicity (dulaglutide) Ozempic, Wegovy (semaglutide) Mounjaro (tirzepatide) Bydureon Bcise (exanatide extended release)  DO NOT TAKE 1 DAY PRIOR TO YOUR TEST Rybelsus (semaglutide) Adlyxin (lixisenatide) Victoza (liraglutide) Byetta (exanatide) ___________________________________________________________________________     If your blood pressure at your visit was 140/90 or greater, please contact your primary care physician to follow up on this.  _______________________________________________________  If you are age 62 or older, your body mass index should be between 23-30. Your Body mass index is 38.59 kg/m. If this is out of the aforementioned range listed, please consider follow up with your Primary Care Provider.  If you are age 85 or younger, your body mass index should be between 19-25. Your Body mass index is 38.59 kg/m. If this is out of the aformentioned range listed, please consider follow up with your Primary Care Provider.   ________________________________________________________  The [place] [facility] providers would like to encourage you to use [facility] to communicate with providers for non-urgent requests or questions.  Due to long hold times on the telephone, sending your provider a message by [facility] may be a faster and more efficient way to get a response.  Please allow 48 business hours for a response.  Please remember that  this is for non-urgent requests.  _______________________________________________________  Thank you for entrusting me with your care and for choosing [facility], Dr. Eulah Pont

## 2023-08-02 LAB — TISSUE TRANSGLUTAMINASE, IGA: (tTG) Ab, IgA: <1 U/mL

## 2023-08-02 LAB — IGA: Immunoglobulin A: 221 mg/dL (ref 70–320)

## 2023-08-03 ENCOUNTER — Encounter: Payer: Self-pay | Admitting: Internal Medicine

## 2023-09-04 ENCOUNTER — Encounter: Payer: Medicare Other | Admitting: Internal Medicine

## 2023-09-09 ENCOUNTER — Encounter: Payer: Self-pay | Admitting: Certified Registered Nurse Anesthetist

## 2023-09-12 ENCOUNTER — Ambulatory Visit: Payer: Medicare Other | Admitting: Internal Medicine

## 2023-09-12 ENCOUNTER — Encounter: Payer: Self-pay | Admitting: Internal Medicine

## 2023-09-12 VITALS — BP 138/74 | HR 89 | Temp 98.2°F | Resp 12 | Ht 62.0 in | Wt 211.0 lb

## 2023-09-12 DIAGNOSIS — R634 Abnormal weight loss: Secondary | ICD-10-CM

## 2023-09-12 DIAGNOSIS — R110 Nausea: Secondary | ICD-10-CM

## 2023-09-12 DIAGNOSIS — D131 Benign neoplasm of stomach: Secondary | ICD-10-CM | POA: Diagnosis not present

## 2023-09-12 DIAGNOSIS — K317 Polyp of stomach and duodenum: Secondary | ICD-10-CM | POA: Diagnosis not present

## 2023-09-12 DIAGNOSIS — K2970 Gastritis, unspecified, without bleeding: Secondary | ICD-10-CM

## 2023-09-12 DIAGNOSIS — K3189 Other diseases of stomach and duodenum: Secondary | ICD-10-CM | POA: Diagnosis not present

## 2023-09-12 DIAGNOSIS — R630 Anorexia: Secondary | ICD-10-CM

## 2023-09-12 MED ORDER — SODIUM CHLORIDE 0.9 % IV SOLN: 500.0000 mL | INTRAVENOUS | Status: DC

## 2023-09-12 MED ORDER — PANTOPRAZOLE SODIUM 40 MG PO TBEC: 40.0000 mg | DELAYED_RELEASE_TABLET | Freq: Every day | ORAL | 0 refills | Status: DC

## 2023-09-12 NOTE — Progress Notes (Unsigned)
 1627 Nasopharyngeal airway size 7.0  placed without trauma.  Pt experienced laryngeal spasm with jaw thrust.

## 2023-09-12 NOTE — Op Note (Signed)
 [facility] Patient Name: Audrey Yoder Procedure Date: 09/12/2023 4:01 PM MRN: [id number] Endoscopist: Madelyn Brunner [place] , , [id number] Age: 73 Referring MD:  Date of Birth: Aug 03, 1950 Gender: Female Account #: [id number] Procedure:                Upper GI endoscopy Indications:              Nausea, Weight loss Medicines:                Monitored Anesthesia Care Procedure:                Pre-Anesthesia Assessment:                           - Prior to the procedure, a History and Physical                            was performed, and patient medications and                            allergies were reviewed. The patient's tolerance of                            previous anesthesia was also reviewed. The risks                            and benefits of the procedure and the sedation                            options and risks were discussed with the patient.                            All questions were answered, and informed consent                            was obtained. Prior Anticoagulants: The patient has                            taken no anticoagulant or antiplatelet agents. ASA                            Grade Assessment: III - A patient with severe                            systemic disease. After reviewing the risks and                            benefits, the patient was deemed in satisfactory                            condition to undergo the procedure.                           After obtaining informed consent, the endoscope was  passed under direct vision. Throughout the                            procedure, the patient's blood pressure, pulse, and                            oxygen saturations were monitored continuously. The                            Olympus Scope SN [id number] was introduced through the                            mouth, and advanced to the second part of duodenum.                            The upper GI  endoscopy was accomplished without                            difficulty. The patient tolerated the procedure                            well. Scope In: Scope Out: Findings:                 The examined esophagus was normal.                           Localized inflammation characterized by congestion                            (edema), erosions and erythema was found in the                            gastric body and in the gastric antrum. Biopsies                            were taken with a cold forceps for histology.                           Four 5 to 8 mm sessile polyps with no bleeding and                            no stigmata of recent bleeding were found in the                            gastric body. These polyps were removed with a cold                            snare. Resection and retrieval were complete.                           A single 12 mm submucosal nodule (favored to be a  lipoma) with no bleeding and no stigmata of recent                            bleeding was found at the incisura. Biopsies were                            taken with a cold forceps for histology.                           Localized nodular mucosa was found in the duodenal                            bulb. Biopsies were taken with a cold forceps for                            histology. Complications:            No immediate complications. Estimated Blood Loss:     Estimated blood loss was minimal. Impression:               - Normal esophagus.                           - Gastritis. Biopsied.                           - Four gastric polyps. Resected and retrieved.                           - A single submucosal nodule (favored to be a                            lipoma) found in the stomach. Biopsied.                           - Nodular mucosa in the duodenal bulb. Biopsied. Recommendation:           - Discharge patient to home (with escort).                           - Use  Protonix (pantoprazole) 40 mg PO daily for 8                            weeks.                           - Return to GI clinic in 2-3 months.                           - The findings and recommendations were discussed                            with the patient. Dr Particia Lather "Alan Ripper" [place],  09/12/2023 4:43:04 PM

## 2023-09-12 NOTE — Progress Notes (Signed)
 Called to room to assist during endoscopic procedure.  Patient ID and intended procedure confirmed with present staff. Received instructions for my participation in the procedure from the performing physician.

## 2023-09-12 NOTE — Progress Notes (Unsigned)
 GASTROENTEROLOGY PROCEDURE H&P NOTE   Primary Care Physician: Margaree Mackintosh, MD    Reason for Procedure:   Weight loss, poor appetite, nausea, right sided ab pain  Plan:    EGD  Patient is appropriate for endoscopic procedure(s) in the ambulatory ([facility]) setting.  The nature of the procedure, as well as the risks, benefits, and alternatives were carefully and thoroughly reviewed with the patient. Ample time for discussion and questions allowed. The patient understood, was satisfied, and agreed to proceed.     HPI: Audrey Yoder is a 73 y.o. female who presents for EGD for evaluation of weight loss, poor appetite, nausea, right sided ab pain.  Patient was most recently seen in the Gastroenterology Clinic on 08/01/23.  No interval change in medical history since that appointment. Please refer to that note for full details regarding GI history and clinical presentation.   Past Medical History:  Diagnosis Date   Allergy    Arthritis    CAD in native artery 03/14/2022   Elevated blood pressure reading 03/14/2022   Endometrial ca [facility])    Hx of colonic polyps 03/31/2016    Past Surgical History:  Procedure Laterality Date   ABDOMINAL HYSTERECTOMY     TOTAL VAGINAL HYSTERECTOMY      Prior to Admission medications   Medication Sig Start Date End Date Taking? Authorizing Provider  cholecalciferol (VITAMIN D3) 25 MCG (1000 UNIT) tablet Take 1,000 Units by mouth daily.    [provider]  Multiple Vitamin (MULTIVITAMIN) tablet Take 1 tablet by mouth daily.    [provider]    Current Outpatient Medications  Medication Sig Dispense Refill   cholecalciferol (VITAMIN D3) 25 MCG (1000 UNIT) tablet Take 1,000 Units by mouth daily.     Multiple Vitamin (MULTIVITAMIN) tablet Take 1 tablet by mouth daily.     Current Facility-Administered Medications  Medication Dose Route Frequency Provider Last Rate Last Admin   0.9 %  sodium chloride infusion  500 mL  Intravenous Continuous Imogene Burn, MD        Allergies as of 09/12/2023 - Review Complete 09/09/2023  Allergen Reaction Noted   Sulfa antibiotics Rash 12/28/2010   Levofloxacin  09/19/2011   Repatha [evolocumab] Other (See Comments) 12/04/2022   Statins  11/03/2022    Family History  Problem Relation Age of Onset   Stroke Mother 30   Other Father        Miloprolific disease   Other Brother        Miloprolific disease   Stroke Maternal Uncle    COPD Paternal Grandfather    Colon cancer Neg Hx    Esophageal cancer Neg Hx    Rectal cancer Neg Hx    Stomach cancer Neg Hx     Social History   Socioeconomic History   Marital status: Married    Spouse name: Not on file   Number of children: Not on file   Years of education: Not on file   Highest education level: Not on file  Occupational History   Not on file  Tobacco Use   Smoking status: Never   Smokeless tobacco: Never  Vaping Use   Vaping status: Never Used  Substance and Sexual Activity   Alcohol use: Yes    Alcohol/week: 2.0 standard drinks of alcohol    Types: 2 Glasses of wine per week   Drug use: No   Sexual activity: Not on file  Other Topics Concern   Not on  file  Social History Narrative   Married, is an elder Social worker and estate attorney   Social Drivers of Corporate investment banker Strain: Not on file  Food Insecurity: No Food Insecurity (03/14/2022)   Hunger Vital Sign    Worried About Running Out of Food in the Last Year: Never true    Ran Out of Food in the Last Year: Never true  Transportation Needs: Not on file  Physical Activity: Insufficiently Active (03/14/2022)   Exercise Vital Sign    Days of Exercise per Week: 7 days    Minutes of Exercise per Session: 20 min  Stress: Not on file  Social Connections: Not on file  Intimate Partner Violence: Not on file    Physical Exam: Vital signs in last 24 hours: There were no vitals taken for this visit. GEN: NAD EYE: Sclerae anicteric ENT:  MMM CV: Non-tachycardic Pulm: No increased WOB GI: Soft NEURO:  Alert & Oriented   Eulah Pont, MD [facility]   09/12/2023 2:24 PM

## 2023-09-12 NOTE — Progress Notes (Unsigned)
 Report given to PACU, vss

## 2023-09-12 NOTE — Patient Instructions (Signed)
 START taking protonix 40 mg once daily for 8 weeks.   Please see scheduled follow up appointment.   Handouts Provided:  Gastritis  YOU HAD AN ENDOSCOPIC PROCEDURE TODAY AT THE [facility]:   Refer to the procedure report that was given to you for any specific questions about what was found during the examination.  If the procedure report does not answer your questions, please call your gastroenterologist to clarify.  If you requested that your care partner not be given the details of your procedure findings, then the procedure report has been included in a sealed envelope for you to review at your convenience later.  YOU SHOULD EXPECT: Some feelings of bloating in the abdomen. Passage of more gas than usual.  Walking can help get rid of the air that was put into your GI tract during the procedure and reduce the bloating. If you had a lower endoscopy (such as a colonoscopy or flexible sigmoidoscopy) you may notice spotting of blood in your stool or on the toilet paper. If you underwent a bowel prep for your procedure, you may not have a normal bowel movement for a few days.  Please Note:  You might notice some irritation and congestion in your nose or some drainage.  This is from the oxygen used during your procedure.  There is no need for concern and it should clear up in a day or so.  SYMPTOMS TO REPORT IMMEDIATELY:  Following upper endoscopy (EGD)  Vomiting of blood or coffee ground material  New chest pain or pain under the shoulder blades  Painful or persistently difficult swallowing  New shortness of breath  Fever of 100F or higher  Black, tarry-looking stools  For urgent or emergent issues, a gastroenterologist can be reached at any hour by calling [phone]. Do not use MyChart messaging for urgent concerns.    DIET:  We do recommend a small meal at first, but then you may proceed to your regular diet.  Drink plenty of fluids but you should avoid alcoholic beverages  for 24 hours.  ACTIVITY:  You should plan to take it easy for the rest of today and you should NOT DRIVE or use heavy machinery until tomorrow (because of the sedation medicines used during the test).    FOLLOW UP: Our staff will call the number listed on your records the next business day following your procedure.  We will call around 7:15- 8:00 am to check on you and address any questions or concerns that you may have regarding the information given to you following your procedure. If we do not reach you, we will leave a message.     If any biopsies were taken you will be contacted by phone or by letter within the next 1-3 weeks.  Please call us at [phone] if you have not heard about the biopsies in 3 weeks.    SIGNATURES/CONFIDENTIALITY: You and/or your care partner have signed paperwork which will be entered into your electronic medical record.  These signatures attest to the fact that that the information above on your After Visit Summary has been reviewed and is understood.  Full responsibility of the confidentiality of this discharge information lies with you and/or your care-partner.

## 2023-09-12 NOTE — Progress Notes (Unsigned)
 1620 HR > 100 with esmolol 25 mg given IV, MD updated, vss

## 2023-09-12 NOTE — Progress Notes (Unsigned)
 1613 Robinul 0.1 mg IV given due large amount of secretions upon assessment.  MD made aware, vss

## 2023-09-12 NOTE — Progress Notes (Unsigned)
1628 HR > 100 with esmolol 25 mg given IV, MD updated, vss

## 2023-09-13 ENCOUNTER — Telehealth: Payer: Self-pay

## 2023-09-13 NOTE — Telephone Encounter (Signed)
  Follow up Call-     09/12/2023    2:24 PM 01/05/2023   10:56 AM  Call back number  Post procedure Call Back phone  # [phone]  Permission to leave phone message Yes Yes     Patient questions:  Do you have a fever, pain , or abdominal swelling? No. Pain Score  0 *  Have you tolerated food without any problems? Yes.    Have you been able to return to your normal activities? Yes.    Do you have any questions about your discharge instructions: Diet   No. Medications  No. Follow up visit  No.  Do you have questions or concerns about your Care? No.  Actions: * If pain score is 4 or above: No action needed, pain <4.

## 2023-09-17 LAB — SURGICAL PATHOLOGY

## 2023-09-18 ENCOUNTER — Encounter: Payer: Self-pay | Admitting: Internal Medicine

## 2023-10-26 LAB — HM MAMMOGRAPHY

## 2023-10-29 ENCOUNTER — Encounter: Payer: Self-pay | Admitting: Internal Medicine

## 2023-11-30 ENCOUNTER — Ambulatory Visit (INDEPENDENT_AMBULATORY_CARE_PROVIDER_SITE_OTHER): Admitting: Internal Medicine

## 2023-11-30 ENCOUNTER — Encounter: Payer: Self-pay | Admitting: Internal Medicine

## 2023-11-30 VITALS — BP 126/70 | HR 68 | Ht 61.5 in | Wt 206.1 lb

## 2023-11-30 DIAGNOSIS — R195 Other fecal abnormalities: Secondary | ICD-10-CM

## 2023-11-30 DIAGNOSIS — R109 Unspecified abdominal pain: Secondary | ICD-10-CM

## 2023-11-30 DIAGNOSIS — K529 Noninfective gastroenteritis and colitis, unspecified: Secondary | ICD-10-CM

## 2023-11-30 DIAGNOSIS — R110 Nausea: Secondary | ICD-10-CM | POA: Diagnosis not present

## 2023-11-30 DIAGNOSIS — K760 Fatty (change of) liver, not elsewhere classified: Secondary | ICD-10-CM

## 2023-11-30 DIAGNOSIS — K219 Gastro-esophageal reflux disease without esophagitis: Secondary | ICD-10-CM

## 2023-11-30 DIAGNOSIS — R634 Abnormal weight loss: Secondary | ICD-10-CM | POA: Diagnosis not present

## 2023-11-30 DIAGNOSIS — R630 Anorexia: Secondary | ICD-10-CM

## 2023-11-30 DIAGNOSIS — Z860100 Personal history of colon polyps, unspecified: Secondary | ICD-10-CM

## 2023-11-30 DIAGNOSIS — Z8719 Personal history of other diseases of the digestive system: Secondary | ICD-10-CM

## 2023-11-30 MED ORDER — VOQUEZNA 10 MG PO TABS: 10.0000 mg | ORAL_TABLET | Freq: Every day | ORAL | 1 refills | Status: AC

## 2023-11-30 NOTE — Patient Instructions (Signed)
 _______________________________________________________  If your blood pressure at your visit was 140/90 or greater, please contact your primary care physician to follow up on this.  _______________________________________________________  If you are age 74 or older, your body mass index should be between 23-30. Your Body mass index is 38.32 kg/m. If this is out of the aforementioned range listed, please consider follow up with your Primary Care Provider.  If you are age 32 or younger, your body mass index should be between 19-25. Your Body mass index is 38.32 kg/m. If this is out of the aformentioned range listed, please consider follow up with your Primary Care Provider.   ________________________________________________________  The [facility] providers would like to encourage you to use MYCHART to communicate with providers for non-urgent requests or questions.  Due to long hold times on the telephone, sending your provider a message by [facility] may be a faster and more efficient way to get a response.  Please allow 48 business hours for a response.  Please remember that this is for non-urgent requests.  _______________________________________________________  We have sent the following medications to [facility]: Voquezna 10mg  daily  Please purchase the following medications over the counter and take as directed: Gas-x as needed  Please follow up in 3 months. Give us  a call at [phone] to schedule an appointment.  Low FODMAP Diet: (Fermentable Oligosaccharides, Disaccharides, Monosaccharides, and Polyols) These are short chain carbohydrates and sugar alcohols that are poorly absorbed by the body, resulting in multiple abdominal symptoms, including changes in bowel habits, abdominal pain/discomfort, bloating, abdominal distension, gas, etc.      It was a pleasure to see you today!  Thank you for trusting me with your gastrointestinal care!

## 2023-11-30 NOTE — Progress Notes (Signed)
 Chief Complaint: Weight loss  HPI : 73 year old female with history of CAD, arthritis, endometrial cancer in remission, and colon polyps presents for follow up of weight loss  Over a year ago, she started dealing with poor appetite and weight loss (lost 23 lbs). She finished her endometrial cancer treatment in 2011 and she was told that she was in remission. She is a Firefighter. She was not on NSAIDs prior to her most recent colonoscopy.  Interval History: She did take her Protonix  for 10 days but then had to stop due to muscle aches and joint pains. While she was on Protonix , she did not have a flare of her symptoms, though she could not tell for sure whether or not it was helping. She tends to be sensitive to medications and can develop muscle aches/joint pains fairly frequently on different types of medications. Endorses nausea in the mornings and she has trouble eating breakfast as a result. Endorses loose stools 1-5 BMs per day. Endorses flatulence. Endorses chest burning. Maybe also some regurgitation. She has not really had a chance to follow a low FODMAP diet  Wt Readings from Last 3 Encounters:  11/30/23 206 lb 2 oz (93.5 kg)  09/12/23 211 lb (95.7 kg)  08/01/23 211 lb (95.7 kg)   Past Medical History:  Diagnosis Date   Allergy    Arthritis    CAD in native artery 03/14/2022   Elevated blood pressure reading 03/14/2022   Endometrial ca [facility])    Hx of colonic polyps 03/31/2016   Past Surgical History:  Procedure Laterality Date   ABDOMINAL HYSTERECTOMY     TOTAL VAGINAL HYSTERECTOMY     Family History  Problem Relation Age of Onset   Stroke Mother 8   Atrial fibrillation Mother    Other Father        Miloprolific disease   Other Brother        Miloprolific disease   COPD Paternal Grandfather    Stroke Maternal Uncle    Atrial fibrillation Maternal Uncle    Atrial fibrillation Maternal Cousin    Stroke Maternal Cousin    Colon cancer Neg Hx     Esophageal cancer Neg Hx    Rectal cancer Neg Hx    Stomach cancer Neg Hx    Social History   Tobacco Use   Smoking status: Never   Smokeless tobacco: Never  Vaping Use   Vaping status: Never Used  Substance Use Topics   Alcohol use: Yes    Alcohol/week: 2.0 standard drinks of alcohol    Types: 2 Glasses of wine per week    Comment: one every 6 or 7 months   Drug use: No   Current Outpatient Medications  Medication Sig Dispense Refill   cholecalciferol (VITAMIN D3) 25 MCG (1000 UNIT) tablet Take 1,000 Units by mouth daily.     Multiple Vitamin (MULTIVITAMIN) tablet Take 1 tablet by mouth daily.     pantoprazole  (PROTONIX ) 40 MG tablet Take 1 tablet (40 mg total) by mouth daily. 60 tablet 0   No current facility-administered medications for this visit.   Allergies  Allergen Reactions   Levofloxacin Other (See Comments)    Joint pain   Sulfa Antibiotics Rash   Repatha  [Evolocumab ] Other (See Comments)    Doesn't work/ severe muscle pain    Statins Other (See Comments)    Muscle pain   Protonix  [Pantoprazole ]     Joint pains   Review of Systems: All  systems reviewed and negative except where noted in HPI.   Physical Exam: BP 126/70 (BP Location: Left Arm, Patient Position: Sitting, Cuff Size: Large)   Pulse 68   Ht 5' 1.5 (1.562 m)   Wt 206 lb 2 oz (93.5 kg)   BMI 38.32 kg/m  Constitutional: Pleasant,well-developed, female in no acute distress. HEENT: Normocephalic and atraumatic. Conjunctivae are normal. No scleral icterus. Cardiovascular: Normal rate, regular rhythm.  Pulmonary/chest: Effort normal and breath sounds normal. No wheezing, rales or rhonchi. Abdominal: Soft, nondistended, nontender. Bowel sounds active throughout. There are no masses palpable. No hepatomegaly. Extremities: No edema Neurological: Alert and oriented to person place and time. Skin: Skin is warm and dry. No rashes noted. Psychiatric: Normal mood and affect. Behavior is  normal.  Labs 10/2022: CBC nml. CMP with elevated potassium of 5.4. Lipase nml. TSH nml. Free T4 nml. CA125 nml.   Labs 06/2023: CRP was elevated (patient could not recall exactly how high)  Labs 07/2023: TTG IgA negative. IgA nml. CRP nml.   RUQ U/S 12/12/22: IMPRESSION: 1. Diffuse increased echogenicity throughout the liver is nonspecific and may be seen with hepatic steatosis or other underlying intrinsic liver disease. 2. No other abnormalities.  Colonoscopy 03/23/16: - One 10 mm polyp in the cecum, removed piecemeal using a cold snare. Resected and retrieved. - Severe diverticulosis in the left colon. - The examination was otherwise normal on direct and retroflexion views. Path: Surgical [P], cecum, polyp -SESSILE SERRATED POLYP/ADENOMA. -NO HIGH GRADE DYSPLASIA OR MALIGNANCY IDENTIFIED.  Colonoscopy 01/09/23: - The terminal ileum appeared normal. - Multiple scattered non- bleeding erosions were found in the ascending colon and in the cecum. No stigmata of recent bleeding were seen. Biopsies were taken with a cold forceps for histology. - Three sessile polyps were found in the ascending colon. The polyps were 3 to 15 mm in size. These polyps were removed with a cold snare. Resection and retrieval were complete. - Multiple diverticula were found in the sigmoid colon, descending colon, transverse colon and ascending colon. - A localized area of erythematous mucosa was found in the sigmoid colon. This was biopsied with a cold forceps for histology. - A 3 mm polyp was found in the sigmoid colon. The polyp was sessile. The polyp was removed with a cold snare. Resection and retrieval were complete. - Non- bleeding internal hemorrhoids were found during retroflexion. Path: 1. Surgical [P], colon, ascending, sigmoid, polyp (4) - SESSILE SERRATED POLYP(S). - NO DYSPLASIA OR MALIGNANCY. - POLYPOID FRAGMENT OF BENIGN COLONIC MUCOSA WITH LYMPHOID AGGREGATE 2. Surgical [P], colon erosion bx -  NONSPECIFIC CHRONIC AND FOCALLY ACTIVE COLITIS - Findings are nonspecific and diagnostic considerations include NSAIDs, infection, and less likely inflammatory bowel disease. Clinical and endoscopic correlation is recommended. - NEGATIVE FOR VIRAL CYTOPATHIC EFFECT OR GRANULOMAS - NEGATIVE FOR DYSPLASIA OR MALIGNANCY 3. Surgical [P], left colon bx - BENIGN COLONIC MUCOSA WITH MILD REACTIVE/REPARATIVE CHANGES - NEGATIVE FOR ACUTE/CHRONIC INFLAMMATION - NEGATIVE FOR DYSPLASIA OR MALIGNANCY  EGD 09/12/23: - Normal esophagus. - Gastritis. Biopsied. - Four gastric polyps. Resected and retrieved. - A single submucosal nodule ( favored to be a lipoma) found in the stomach. Biopsied. - Nodular mucosa in the duodenal bulb. Biopsied. Path: 1. Surgical [P], duodenal (nodular mucosa) :      -  DUODENAL AND OXYNTIC MUCOSA CONSISTENT WITH GASTRIC HETEROTOPIA IN THE      APPROPRIATE CLINICAL/ENDOSCOPIC SETTING.      2. Surgical [P], gastric antrum and gastric body :      -  ANTRAL AND OXYNTIC MUCOSA WITH NO SIGNIFICANT PATHOLOGY.      -  NO HELICOBACTER PYLORI ORGANISMS IDENTIFIED ON H&E STAINED SLIDE.      3. Surgical [P], stomach (gastric nodular) :      -  OXYNTIC MUCOSA WITH NO SIGNIFICANT PATHOLOGY.      -  NO HELICOBACTER PYLORI ORGANISMS IDENTIFIED ON H&E STAINED SLIDE.      4. Surgical [P], gastric polyps :      -  CHIEF CELL/OXYNTIC GLAND ADENOMA, FRAGMENTS.      -  FRAGMENT OF POLYPOID FOVEOLAR HYPERPLASIA.      NOTE: DR. LU HAS PEER-REVIEWED THE CASE AND AGREES WITH THE INTERPRETATION   ASSESSMENT AND PLAN: Weight loss Poor appetite Nausea Occasional GERD History of gastric adenoma Right sided ab pain Right sided colon erosions with chronic colitis Loose stools Fatty liver History of colon polyps Patient presents for follow up of weight loss, nausea, loose stools, and flatulence. I did start her on PPI therapy after her recent EGD procedure but she was not able to tolerate Protonix   due to development of myalgias/arthralgias. Will see if she tolerates Voquezna better since there is no risk of myalgia/arthralgia based upon the side effect profile. I asked her to look through the low FODMAP diet as well to see if there may be any food culprits for her symptoms. I asked her to try Gas-X for her flatulence. - Will give handout on low FODMAP diet - Start vonoprazan 10 mg daily - Use Gas-X PRN - Declined Bentyl  - Consider SIBO breath test in the future - Repeat EGD due in 08/2024 due to history of gastric adenoma - Next colonoscopy due in 12/2025 for polyp surveillance - Could consider mesalamine or sulfasalazine in the future for possible IBD - RTC in 3 months  Regino Caprio, MD  I spent 33 minutes of time, including in depth chart review, independent review of results as outlined above, communicating results with the patient directly, face-to-face time with the patient, coordinating care, ordering studies and medications as appropriate, and documentation.

## 2023-12-10 ENCOUNTER — Other Ambulatory Visit: Payer: Medicare Other

## 2023-12-10 DIAGNOSIS — Z0000 Encounter for general adult medical examination without abnormal findings: Secondary | ICD-10-CM

## 2023-12-10 DIAGNOSIS — K580 Irritable bowel syndrome with diarrhea: Secondary | ICD-10-CM

## 2023-12-10 DIAGNOSIS — Z1329 Encounter for screening for other suspected endocrine disorder: Secondary | ICD-10-CM

## 2023-12-10 DIAGNOSIS — M1712 Unilateral primary osteoarthritis, left knee: Secondary | ICD-10-CM

## 2023-12-10 DIAGNOSIS — E782 Mixed hyperlipidemia: Secondary | ICD-10-CM

## 2023-12-11 LAB — COMPLETE METABOLIC PANEL WITHOUT GFR
AG Ratio: 1.8 (calc) (ref 1.0–2.5)
ALT: 15 U/L (ref 6–29)
AST: 15 U/L (ref 10–35)
Albumin: 4.2 g/dL (ref 3.6–5.1)
Alkaline phosphatase (APISO): 98 U/L (ref 37–153)
BUN: 24 mg/dL (ref 7–25)
CO2: 29 mmol/L (ref 20–32)
Calcium: 9.3 mg/dL (ref 8.6–10.4)
Chloride: 106 mmol/L (ref 98–110)
Creat: 0.86 mg/dL (ref 0.60–1.00)
Globulin: 2.3 g/dL (ref 1.9–3.7)
Glucose, Bld: 89 mg/dL (ref 65–99)
Potassium: 5.3 mmol/L (ref 3.5–5.3)
Sodium: 143 mmol/L (ref 135–146)
Total Bilirubin: 0.5 mg/dL (ref 0.2–1.2)
Total Protein: 6.5 g/dL (ref 6.1–8.1)

## 2023-12-11 LAB — CBC WITH DIFFERENTIAL/PLATELET
Absolute Lymphocytes: 916 {cells}/uL (ref 850–3900)
Absolute Monocytes: 510 {cells}/uL (ref 200–950)
Basophils Absolute: 20 {cells}/uL (ref 0–200)
Basophils Relative: 0.4 %
Eosinophils Absolute: 49 {cells}/uL (ref 15–500)
Eosinophils Relative: 1 %
HCT: 45 % (ref 35.0–45.0)
Hemoglobin: 14.8 g/dL (ref 11.7–15.5)
MCH: 31 pg (ref 27.0–33.0)
MCHC: 32.9 g/dL (ref 32.0–36.0)
MCV: 94.1 fL (ref 80.0–100.0)
MPV: 9.7 fL (ref 7.5–12.5)
Monocytes Relative: 10.4 %
Neutro Abs: 3406 {cells}/uL (ref 1500–7800)
Neutrophils Relative %: 69.5 %
Platelets: 256 10*3/uL (ref 140–400)
RBC: 4.78 10*6/uL (ref 3.80–5.10)
RDW: 12.7 % (ref 11.0–15.0)
Total Lymphocyte: 18.7 %
WBC: 4.9 10*3/uL (ref 3.8–10.8)

## 2023-12-11 LAB — LIPID PANEL
Cholesterol: 180 mg/dL (ref <200)
HDL: 55 mg/dL (ref >=50)
LDL Cholesterol (Calc): 108 mg/dL — ABNORMAL HIGH
Non-HDL Cholesterol (Calc): 125 mg/dL (ref <130)
Total CHOL/HDL Ratio: 3.3 (calc) (ref <5.0)
Triglycerides: 78 mg/dL (ref <150)

## 2023-12-11 LAB — TSH: TSH: 0.57 m[IU]/L (ref 0.40–4.50)

## 2023-12-13 ENCOUNTER — Ambulatory Visit: Payer: Medicare Other | Admitting: Internal Medicine

## 2023-12-28 ENCOUNTER — Encounter: Payer: Self-pay | Admitting: Internal Medicine

## 2023-12-28 ENCOUNTER — Ambulatory Visit: Admitting: Internal Medicine

## 2023-12-28 VITALS — BP 150/90 | HR 84 | Ht 61.5 in | Wt 210.0 lb

## 2023-12-28 DIAGNOSIS — Z8542 Personal history of malignant neoplasm of other parts of uterus: Secondary | ICD-10-CM | POA: Diagnosis not present

## 2023-12-28 DIAGNOSIS — Z87442 Personal history of urinary calculi: Secondary | ICD-10-CM

## 2023-12-28 DIAGNOSIS — K580 Irritable bowel syndrome with diarrhea: Secondary | ICD-10-CM

## 2023-12-28 DIAGNOSIS — E782 Mixed hyperlipidemia: Secondary | ICD-10-CM

## 2023-12-28 DIAGNOSIS — Z8349 Family history of other endocrine, nutritional and metabolic diseases: Secondary | ICD-10-CM

## 2023-12-28 DIAGNOSIS — Z0000 Encounter for general adult medical examination without abnormal findings: Secondary | ICD-10-CM | POA: Diagnosis not present

## 2023-12-28 DIAGNOSIS — M1712 Unilateral primary osteoarthritis, left knee: Secondary | ICD-10-CM | POA: Diagnosis not present

## 2023-12-28 LAB — POCT URINALYSIS DIP (CLINITEK)
Bilirubin, UA: NEGATIVE
Blood, UA: NEGATIVE
Glucose, UA: NEGATIVE mg/dL
Ketones, POC UA: NEGATIVE mg/dL
Leukocytes, UA: NEGATIVE
Nitrite, UA: NEGATIVE
POC PROTEIN,UA: NEGATIVE
Spec Grav, UA: 1.015 (ref 1.010–1.025)
Urobilinogen, UA: 0.2 U/dL
pH, UA: 6 (ref 5.0–8.0)

## 2023-12-28 NOTE — Progress Notes (Signed)
 Annual Wellness Visit   Patient Care Team: Raiden Haydu, Ronal PARAS, MD as PCP - General (Internal Medicine) Raford Riggs, MD as PCP - Cardiology (Cardiology)  Visit Date: 12/28/23   Chief Complaint  Patient presents with   Annual Exam   Medicare Wellness   Subjective:  Patient: Audrey Yoder, Female DOB: [birth year], 73 y.o. MRN: [id number]  Audrey Yoder is a 73 y.o. Female who presents today for her Annual Wellness Visit. Patient has Obesity; History of Vitamin-D Deficiency; Elevated LDL Cholesterol Level; History of Uterine Cancer; Kidney Stones; Hx of Colonic Polyps; Elevated Blood Pressure Reading; and CAD In Native Artery.  History of Elevated Blood Pressure with Blood Pressure: elevated today at 150/90.   History of Elevated LDL/CAD with 12/10/2023 Lipid Panel: LDL 108, decreased from 115 in 11/2022; otherwise WNL. 2023 Coronary Cardiac Score: 308.   History of Kidney Stones not followed by Urology and usually does not seek medical care during a recurrence, with the last one being in 2019. Has not had a CT Abdomen/Pelvis since 2016. Notably, she does have well water, and she's had a stone analysis through this office in 2015 which was predominantly uric acid, 20% calcium  oxalate w/ subsequent uric acid at 6.9.  Takes Vitamin-D3 1000 units daily and a multivitamin.   Labs 12/10/2023 CBC: WNL CMP: WNL TSH: 0.57  History of Uterine Cancer 2010 S/p Modified Radical Hysterectomy w/ BSO & Bilateral Lymph-node Dissection 06/2009 at [facility] and was treated with vaginal cuff brachytherapy for 3 doses 1 month apart as adjuvant therapy.   Mammogram 10/26/2023  normal with repeat recommendation of 2026.  at last annual visit, she noted some decreased appetite, and was subsequently seen for her Colonoscopy 01/10/2023 with Dr. Federico which found: multiple scattered non- bleeding erosions were found in the ascending colon and in the cecum w/o stigmata of recent bleeding (per pathology  nonspecific chronic and focally active colitis, negative for viral cytopathic effect or granulomas, negative for dysplasia or malignancy); three sessile polyps 15 mm were removed from the ascending colon and a 3 mm sessile polyp was removed from the sigmoid colon (per pathology sessile serrated polyp(s), negative for dysplasia or malignancy, and a polypoid fragment of benign colonic mucosa with lymphoid aggregate); multiple diverticula were found in the sigmoid colon, descending colon, transverse colon and ascending colon; a localized area of erythematous mucosa was found in the sigmoid colon (per pathology benign colonic mucosa with mild reactive/reparative changes, negative for acute/chronic inflammation or dysplasia or malignancy); Non- bleeding internal hemorrhoids were found during retroflexion with repeat recommendation of 2027. She is being followed by Dr. Federico regarding her GI issues, which are being managed with Voquezna  10 mg daily and Pantoprazole  40 mg daily. Had an upper endoscopy 09/12/2023 showing the examined esophagus was normal; localized inflammation characterized by congestion (edema), erosions and erythema was found in the gastric body and in the gastric antrum (per pathology antral and oxyntic mucosa with no significant pathology. no Helicobacter pylori organisms identified on h&e stained slide); four 5 to 8 mm sessile polyps with no bleeding and no stigmata of recent bleeding were removed from the gastric body (per pathology chief cell/oxyntic gland adenoma, fragments. Fragment of polypoid foveolar hyperplasia); a single 12 mm submucosal nodule ( favored to be a lipoma) with no bleeding and no stigmata of recent bleeding was found at the incisura (per pathology oxyntic mucosa with no significant pathology. 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   Bone Density 10/24/2022 T-score -1.5, osteopenic.   Vaccine Counseling: Due for Shingles 1/2 - discussed; UTD on Flu, PNA, and Tdap Past Medical History:  Diagnosis Date   Allergy    Arthritis    CAD in native artery 03/14/2022   Elevated blood pressure reading 03/14/2022   Endometrial ca [facility])    Hx of colonic polyps 03/31/2016   Medical/Surgical History Narrative:   Allergic/Intolerant to:  Allergies  Allergen Reactions   Levofloxacin Other (See Comments)    Joint pain   Sulfa Antibiotics Rash   Repatha  [Evolocumab ] Other (See Comments)    Doesn't work/ severe muscle pain    Statins Other (See Comments)    Muscle pain   Protonix  [Pantoprazole ]     Joint pains   2020 - screened for hemochromatosis w/ ferritin WNL at 70.   2014 - treated for presumed tickborne illness at an UC in [place]. She reported flu-like sx at the time following a recent tick bite  2008 - Hx of Left Knee OA treated with steroid injection by Orthopedist  Family History  Problem Relation Age of Onset   Stroke Mother 36   Atrial fibrillation Mother    Other Father        Miloprolific disease   Other Brother        Miloprolific disease   COPD Paternal Grandfather    Stroke Maternal Uncle    Atrial fibrillation Maternal Uncle    Atrial fibrillation Maternal Cousin    Stroke Maternal Cousin    Colon cancer Neg Hx    Esophageal cancer Neg Hx    Rectal cancer Neg Hx    Stomach cancer Neg Hx    Family History Narrative: No Family History of GI Cancers Paternal Grandfather w/ hx of COPD Father w/ hx of Myeloproliferative Disorder Maternal Uncle w/ hx of Stroke and A-fib Maternal Cousin w/ hx of Stroke and A-fib Mother w/ hx of Stroke, onset age 2, and A-fib 1 Brother w/ hx of Myeloproliferative Disorder and Hemochromatosis Social History   Social History Narrative   Married, is an elder Social worker and estate attorney   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 Non-smoker, occasional wine consumption. Has 2 adult sons and grandchildren.   Review of Systems  Constitutional:  Negative for chills, fever, malaise/fatigue and weight loss.  HENT:  Negative for hearing loss, sinus pain and sore throat.   Respiratory:  Negative for cough, hemoptysis and shortness of breath.   Cardiovascular:  Negative for chest pain, palpitations, leg swelling and PND.  Gastrointestinal:  Negative for abdominal pain, constipation, diarrhea, heartburn, nausea and vomiting.  Genitourinary:  Negative for dysuria, frequency and urgency.  Musculoskeletal:  Negative for back pain, myalgias and neck pain.  Skin:  Negative for itching and rash.  Neurological:  Negative for dizziness, tingling, seizures and headaches.  Endo/Heme/Allergies:  Negative for polydipsia.  Psychiatric/Behavioral:  Negative for depression. The patient is not nervous/anxious.     Objective:  Vitals: BP (!) 150/90   Pulse 84   Ht 5' 1.5 (1.562 m)   Wt 210 lb (95.3 kg)   SpO2 96%   BMI 39.04 kg/m  Physical Exam Vitals and nursing note reviewed.  Constitutional:      General: She is not in acute distress.    Appearance: Normal appearance. She is not ill-appearing or toxic-appearing.  HENT:     Head: Normocephalic and atraumatic.     Right Ear: Hearing and external ear normal. There is impacted cerumen (blocking visual of canal).     Left Ear: Hearing, tympanic membrane, ear canal and external ear normal. There is no impacted cerumen (wax noted, not impacted or blocking visual of canal).     Mouth/Throat:     Pharynx: Oropharynx is clear.  Eyes:     Extraocular Movements: Extraocular movements intact.     Pupils: Pupils are equal, round, and reactive to light.  Neck:     Thyroid: No thyroid mass, thyromegaly or thyroid tenderness.     Vascular: No carotid bruit.  Cardiovascular:     Rate and Rhythm: Normal rate and regular rhythm. No extrasystoles are  present.    Pulses:          Dorsalis pedis pulses are 2+ on the right side and 2+ on the left side.     Heart sounds: Normal heart sounds. No murmur heard.    No friction rub. No gallop.  Pulmonary:     Effort: Pulmonary effort is normal.     Breath sounds: Normal breath sounds. No decreased breath sounds, wheezing, rhonchi or rales.  Chest:     Chest wall: No mass.  Abdominal:     Palpations: Abdomen is soft. There is no hepatomegaly, splenomegaly or mass.     Tenderness: There is no abdominal tenderness.     Hernia: No hernia is present.  Musculoskeletal:     Cervical back: Normal range of motion.     Right lower leg: No edema.     Left lower leg: No edema.  Lymphadenopathy:     Cervical: No cervical adenopathy.     Upper Body:     Right upper body: No supraclavicular adenopathy.     Left upper body: No supraclavicular adenopathy.  Skin:    General: Skin is warm and dry.  Neurological:     General: No focal deficit present.     Mental Status: She is alert and oriented to person, place, and time. Mental status is at baseline.     Sensory: Sensation is intact.     Motor: Motor function is intact. No weakness.     Deep Tendon Reflexes: Reflexes are normal and symmetric.  Psychiatric:        Attention and Perception: Attention normal.        Mood and Affect: Mood normal.        Speech: Speech normal.        Behavior: Behavior normal.        Thought Content: Thought content normal.        Cognition and Memory: Cognition normal.        Judgment: Judgment normal.    Most Recent Functional Status Assessment:    12/28/2023    9:54 AM  In your present state of health, do you have any difficulty performing the following activities:  Hearing? 0  Vision? 0  Difficulty concentrating or making decisions? 0  Walking or climbing stairs? 0  Dressing or bathing? 0  Doing errands, shopping? 0  Preparing Food and eating ? N  Using the Toilet? N  In the past six months, have you  accidently leaked urine? N  Do you have problems with loss of bowel control? N  Managing your Medications? N  Managing your Finances? N  Housekeeping or managing your Housekeeping? N   Most Recent Fall Risk Assessment:  12/28/2023    9:55 AM  Fall Risk   Falls in the past year? 0  Number falls in past yr: 0  Injury with Fall? 0  Risk for fall due to : No Fall Risks  Follow up Falls prevention discussed;Education provided;Falls evaluation completed   Most Recent Depression Screenings:    12/28/2023   10:12 AM 12/16/2022   11:18 AM  PHQ 2/9 Scores  PHQ - 2 Score  0  Exception Documentation Patient refusal    Most Recent Cognitive Screening:    12/28/2023   10:12 AM  6CIT Screen  What Year? 0 points  What month? 0 points  What time? 0 points  Count back from 20 0 points  Months in reverse 0 points  Repeat phrase 0 points  Total Score 0 points   Results:  Studies Obtained And Personally Reviewed By Me:  2023 Coronary Cardiac Score: 308  Mammogram 10/26/2023  normal with repeat recommendation of 2026.  Colonoscopy 01/10/2023 with Dr. Federico which found: multiple scattered non- bleeding erosions were found in the ascending colon and in the cecum w/o stigmata of recent bleeding (per pathology nonspecific chronic and focally active colitis, negative for viral cytopathic effect or granulomas, negative for dysplasia or malignancy); three sessile polyps 15 mm were removed from the ascending colon and a 3 mm sessile polyp was removed from the sigmoid colon (per pathology sessile serrated polyp(s), negative for dysplasia or malignancy, and a polypoid fragment of benign colonic mucosa with lymphoid aggregate); multiple diverticula were found in the sigmoid colon, descending colon, transverse colon and ascending colon; a localized area of erythematous mucosa was found in the sigmoid colon (per pathology benign colonic mucosa with mild reactive/reparative changes, negative for acute/chronic  inflammation or dysplasia or malignancy); Non- bleeding internal hemorrhoids were found during retroflexion.  upper endoscopy 09/12/2023 showing the examined esophagus was normal; localized inflammation characterized by congestion (edema), erosions and erythema was found in the gastric body and in the gastric antrum (per pathology antral and oxyntic mucosa with no significant pathology. no Helicobacter pylori organisms identified on h&e stained slide); four 5 to 8 mm sessile polyps with no bleeding and no stigmata of recent bleeding were removed from the gastric body (per pathology chief cell/oxyntic gland adenoma, fragments. Fragment of polypoid foveolar hyperplasia); a single 12 mm submucosal nodule ( favored to be a lipoma) with no bleeding and no stigmata of recent bleeding was found at the incisura (per pathology oxyntic mucosa with no significant pathology. No Helicobacter pylori organisms identified on h&e stained slide); localized nodular mucosa was found in the duodenal bulb (per pathology duodenal and oxyntic mucosa consistent with gastric heterotopia in the appropriate clinical/endoscopic setting).   Bone Density 10/24/2022 T-score -1.5, osteopenic.  Labs:     Component Value Date/Time   NA 143 12/10/2023 0949   NA 142 07/21/2022 1106   K 5.3 12/10/2023 0949   CL 106 12/10/2023 0949   CO2 29 12/10/2023 0949   GLUCOSE 89 12/10/2023 0949   BUN 24 12/10/2023 0949   BUN 13 07/21/2022 1106   CREATININE 0.86 12/10/2023 0949   CALCIUM  9.3 12/10/2023 0949   PROT 6.5 12/10/2023 0949   PROT 6.3 07/21/2022 1106   ALBUMIN 4.3 07/21/2022 1106   AST 15 12/10/2023 0949   ALT 15 12/10/2023 0949   ALKPHOS 115 07/21/2022 1106   BILITOT 0.5 12/10/2023 0949   BILITOT 0.4 07/21/2022 1106   GFRNONAA 88 11/25/2020 1234   GFRAA 102 11/25/2020 1234  Lab Results  Component Value Date   WBC 4.9 12/10/2023   HGB 14.8 12/10/2023   HCT 45.0 12/10/2023   MCV 94.1 12/10/2023   PLT 256 12/10/2023    Lab Results  Component Value Date   CHOL 180 12/10/2023   HDL 55 12/10/2023   LDLCALC 108 (H) 12/10/2023   TRIG 78 12/10/2023   CHOLHDL 3.3 12/10/2023   Lab Results  Component Value Date   HGBA1C 5.6 12/01/2022    Lab Results  Component Value Date   TSH 0.57 12/10/2023    Assessment & Plan:   Orders Placed This Encounter  Procedures   POCT URINALYSIS DIP (CLINITEK)  Other Labs Reviewed today: CBC: WNL CMP: WNL TSH: 0.57  Elevated Blood Pressure with Blood Pressure: elevated today at 150/90.   Elevated LDL/Hx of CAD with 12/10/2023 Lipid Panel: LDL 108, decreased from 115 in 11/2022; otherwise WNL. 2023 Coronary Cardiac Score: 308.   History of Kidney Stones not followed by Urology and usually does not seek medical care during a recurrence, with the last one being in 2019. Has not had a CT Abdomen/Pelvis since 2016. Notably, she does have well water, and she's had a stone analysis through this office in 2015 which was predominantly uric acid, 20% calcium  oxalate w/ subsequent uric acid at 6.9.  Takes Vitamin-D3 1000 units daily and a multivitamin.   History of Uterine Cancer 2010 S/p Modified Radical Hysterectomy w/ BSO & Bilateral Lymph-node Dissection 06/2009 at [facility] and was treated with vaginal cuff brachytherapy for 3 doses 1 month apart as adjuvant therapy.   Mammogram 10/26/2023  normal with repeat recommendation of 2026.  Colonoscopy 01/10/2023 with Dr. Federico which found: multiple scattered non- bleeding erosions were found in the ascending colon and in the cecum w/o stigmata of recent bleeding (per pathology nonspecific chronic and focally active colitis, negative for viral cytopathic effect or granulomas, negative for dysplasia or malignancy); three sessile polyps 15 mm were removed from the ascending colon and a 3 mm sessile polyp was removed from the sigmoid colon (per pathology sessile serrated polyp(s), negative for dysplasia or malignancy, and a polypoid  fragment of benign colonic mucosa with lymphoid aggregate); multiple diverticula were found in the sigmoid colon, descending colon, transverse colon and ascending colon; a localized area of erythematous mucosa was found in the sigmoid colon (per pathology benign colonic mucosa with mild reactive/reparative changes, negative for acute/chronic inflammation or dysplasia or malignancy); Non- bleeding internal hemorrhoids were found during retroflexion with repeat recommendation of 2027.   She is being followed by Dr. Federico regarding her GI Issues, which are being managed with Voquezna  10 mg daily and Pantoprazole  40 mg daily. Had an upper endoscopy 09/12/2023.   Bone Density 10/24/2022 T-score -1.5, osteopenic.   Vaccine Counseling: Due for Shingles 1/2 - discussed; UTD on Flu, PNA, and Tdap.   Annual wellness visit done today including the all of the following: Reviewed patient's Family Medical History Reviewed and updated list of patient's medical providers Assessment of cognitive impairment was done Assessed patient's functional ability Established a written schedule for health screening services Health Risk Assessent Completed and Reviewed  Discussed health benefits of physical activity, and encouraged her to engage in regular exercise appropriate for her age and condition.    I,Emily Lagle,acting as a Neurosurgeon for Ronal JINNY Hailstone, MD.,have documented all relevant documentation on the behalf of Ronal JINNY Hailstone, MD,as directed by  Ronal JINNY Hailstone, MD while in the presence of Ronal JINNY Hailstone, MD.  IRonal JINNY Hailstone, MD, have reviewed all documentation for this visit. The documentation on 01/11/24 for the exam, diagnosis, procedures, and orders are all accurate and complete.

## 2024-01-03 NOTE — Progress Notes (Signed)
 Subjective:   Audrey Yoder is a 73 y.o. female who presents for Medicare Annual (Subsequent) preventive examination.  Visit Complete: In person  Patient Medicare AWV questionnaire was completed by the patient on 12/28/2023; I have confirmed that all information answered by patient is correct and no changes since this date.  Cardiac Risk Factors include: advanced age (>42men, >88 women);dyslipidemia;obesity (BMI >30kg/m2)     Objective:    Today's Vitals   12/28/23 0954  BP: (!) 150/90  Pulse: 84  SpO2: 96%  Weight: 210 lb (95.3 kg)  Height: 5' 1.5 (1.562 m)  PainSc: 0-No pain   Body mass index is 39.04 kg/m.     12/12/2019    1:25 PM  Advanced Directives  Does Patient Have a Medical Advance Directive? Yes  Type of Advance Directive Healthcare Power of Attorney  Copy of Healthcare Power of Attorney in Chart? No - copy requested  Would patient like information on creating a medical advance directive? No - Patient declined    Current Medications (verified) Outpatient Encounter Medications as of 12/28/2023  Medication Sig   cholecalciferol (VITAMIN D3) 25 MCG (1000 UNIT) tablet Take 1,000 Units by mouth daily.   Multiple Vitamin (MULTIVITAMIN) tablet Take 1 tablet by mouth daily.   pantoprazole  (PROTONIX ) 40 MG tablet Take 1 tablet (40 mg total) by mouth daily.   VOQUEZNA  10 MG TABS Take 10 mg by mouth daily.   No facility-administered encounter medications on file as of 12/28/2023.    Allergies (verified) Levofloxacin, Sulfa antibiotics, Repatha  [evolocumab ], Statins, and Protonix  [pantoprazole ]   History: Past Medical History:  Diagnosis Date   Allergy    Arthritis    CAD in native artery 03/14/2022   Elevated blood pressure reading 03/14/2022   Endometrial ca [facility])    Hx of colonic polyps 03/31/2016   Past Surgical History:  Procedure Laterality Date   ABDOMINAL HYSTERECTOMY     TOTAL VAGINAL HYSTERECTOMY     Family History  Problem Relation Age of  Onset   Stroke Mother 46   Atrial fibrillation Mother    Other Father        Miloprolific disease   Other Brother        Miloprolific disease   COPD Paternal Grandfather    Stroke Maternal Uncle    Atrial fibrillation Maternal Uncle    Atrial fibrillation Maternal Cousin    Stroke Maternal Cousin    Colon cancer Neg Hx    Esophageal cancer Neg Hx    Rectal cancer Neg Hx    Stomach cancer Neg Hx    Social History   Socioeconomic History   Marital status: Married    Spouse name: Not on file   Number of children: Not on file   Years of education: Not on file   Highest education level: Professional school degree (e.g., MD, DDS, DVM, JD)  Occupational History   Not on file  Tobacco Use   Smoking status: Never   Smokeless tobacco: Never  Vaping Use   Vaping status: Never Used  Substance and Sexual Activity   Alcohol use: Yes    Alcohol/week: 2.0 standard drinks of alcohol    Types: 2 Glasses of wine per week    Comment: one every 6 or 7 months   Drug use: No   Sexual activity: Not on file  Other Topics Concern   Not on file  Social History Narrative   Married, is an elder Social worker and estate attorney   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 Non-smoker, occasional wine consumption. Has 2 adult sons and grandchildren.   Social Drivers of Corporate investment banker Strain: Low Risk  (12/05/2023)   Overall Financial Resource Strain (CARDIA)    Difficulty of Paying Living Expenses: Not hard at all  Food Insecurity: No Food Insecurity (12/05/2023)   Hunger Vital Sign    Worried About Running Out of Food in the Last Year: Never true    Ran Out of Food in the Last Year: Never true  Transportation Needs: No Transportation Needs (12/05/2023)   PRAPARE - Administrator, Civil Service (Medical): No    Lack of Transportation (Non-Medical): No  Physical Activity: Insufficiently Active (12/05/2023)   Exercise Vital Sign    Days of Exercise per Week: 6 days     Minutes of Exercise per Session: 20 min  Stress: Patient Declined (12/05/2023)   [employer] of Occupational Health - Occupational Stress Questionnaire    Feeling of Stress: Patient declined  Social Connections: Unknown (12/05/2023)   Social Connection and Isolation Panel    Frequency of Communication with Friends and Family: Patient declined    Frequency of Social Gatherings with Friends and Family: Three times a week    Attends Religious Services: Patient declined    Active Member of Clubs or Organizations: Yes    Attends Banker Meetings: 1 to 4 times per year    Marital Status: Patient declined    Tobacco Counseling Counseling given: Not Answered   Clinical Intake:     Pain Score: 0-No pain                  Activities of Daily Living    12/28/2023    9:54 AM 12/05/2023    7:34 AM  In your present state of health, do you have any difficulty performing the following activities:  Hearing? 0 0  Vision? 0 0  Difficulty concentrating or making decisions? 0 0  Walking or climbing stairs? 0 0  Dressing or bathing? 0 0  Doing errands, shopping? 0 0  Preparing Food and eating ? N N  Using the Toilet? N N  In the past six months, have you accidently leaked urine? N N  Do you have problems with loss of bowel control? N N  Managing your Medications? N N  Managing your Finances? N N  Housekeeping or managing your Housekeeping? N N    Patient Care Team: Perri Ronal PARAS, MD as PCP - General (Internal Medicine) Raford Riggs, MD as PCP - Cardiology (Cardiology)  Indicate any recent Medical Services you may have received from other than [facility] providers in the past year (date may be approximate).     Assessment:   This is a routine wellness examination for [place].  Hearing/Vision screen No results found.   Goals Addressed   None    Depression Screen    12/28/2023   10:12 AM 12/16/2022   11:18 AM 12/01/2021   10:08 AM 11/30/2020   10:14 AM  11/01/2020   10:49 AM 12/15/2018    9:02 PM 11/21/2018   10:15 AM  PHQ 2/9 Scores  PHQ - 2 Score  0 0 0 0 0 0  Exception Documentation Patient refusal          Fall Risk    12/28/2023    9:55 AM 12/05/2023    7:34 AM 03/05/2023   10:23 AM 12/01/2021   10:08 AM 11/30/2020  10:14 AM  Fall Risk   Falls in the past year? 0 0 1 0 0  Number falls in past yr: 0 0 1 0 0  Injury with Fall? 0  0 0 0  Risk for fall due to : No Fall Risks  Other (Comment) No Fall Risks No Fall Risks  Follow up Falls prevention discussed;Education provided;Falls evaluation completed  Education provided Falls evaluation completed  Falls evaluation completed      Data saved with a previous flowsheet row definition    MEDICARE RISK AT HOME: Medicare Risk at Home Any stairs in or around the home?: Yes If so, are there any without handrails?: Yes Home free of loose throw rugs in walkways, pet beds, electrical cords, etc?: Yes Adequate lighting in your home to reduce risk of falls?: Yes Life alert?: No Use of a cane, walker or w/c?: No Grab bars in the bathroom?: No Shower chair or bench in shower?: Yes Elevated toilet seat or a handicapped toilet?: No  TIMED UP AND GO:  Was the test performed?  No    Cognitive Function:        12/28/2023   10:12 AM  6CIT Screen  What Year? 0 points  What month? 0 points  What time? 0 points  Count back from 20 0 points  Months in reverse 0 points  Repeat phrase 0 points  Total Score 0 points    Immunizations Immunization History  Administered Date(s) Administered   Moderna Sars-Covid-2 Vaccination 08/25/2019, 05/21/2020, 06/07/2020   Pneumococcal Conjugate-13 10/26/2014   Pneumococcal Polysaccharide-23 11/25/2019   Tdap 12/29/2010, 11/01/2020   Zoster, Live 12/29/2010    TDAP status: Up to date  Flu Vaccine status: Up to date  Pneumococcal vaccine status: Up to date  Covid-19 vaccine status: Information provided on how to obtain vaccines.   Qualifies  for Shingles Vaccine? Yes   Zostavax completed Yes   Shingrix Completed?: No.    Education has been provided regarding the importance of this vaccine. Patient has been advised to call insurance company to determine out of pocket expense if they have not yet received this vaccine. Advised may also receive vaccine at local pharmacy or Health Dept. Verbalized acceptance and understanding.  Screening Tests Health Maintenance  Topic Date Due   Zoster Vaccines- Shingrix (1 of 2) 06/19/2024 (Originally 10/16/1969)   INFLUENZA VACCINE  01/18/2024   MAMMOGRAM  10/25/2024   Medicare Annual Wellness (AWV)  12/27/2024   Colonoscopy  01/08/2026   DTaP/Tdap/Td (3 - Td or Tdap) 11/02/2030   Pneumococcal Vaccine: 50+ Years  Completed   DEXA SCAN  Completed   Hepatitis B Vaccines  Aged Out   HPV VACCINES  Aged Out   Meningococcal B Vaccine  Aged Out   COVID-19 Vaccine  Discontinued   Hepatitis C Screening  Discontinued    Health Maintenance  There are no preventive care reminders to display for this patient.  Colorectal cancer screening: Type of screening: Colonoscopy. Completed 01/09/2023. Repeat every 3 years  Mammogram status: Completed 05/092025. Repeat every year  Bone Density status: Completed 10/24/2022. Results reflect: Bone density results: OSTEOPENIA. Repeat every 2 years.  Lung Cancer Screening: (Low Dose CT Chest recommended if Age 21-80 years, 20 pack-year currently smoking OR have quit w/in 15years.) does not qualify.    Additional Screening:  Hepatitis C Screening: does not qualify; Completed   Vision Screening: Recommended annual ophthalmology exams for early detection of glaucoma and other disorders of the eye. Is the patient up  to date with their annual eye exam?  Yes  If pt is not established with a provider, would they like to be referred to a provider to establish care? No .   Dental Screening: Recommended annual dental exams for proper oral hygiene   Community  Resource Referral / Chronic Care Management: CRR required this visit?  No   CCM required this visit?  No     Plan:     I have personally reviewed and noted the following in the patient's chart:   Medical and social history Use of alcohol, tobacco or illicit drugs  Current medications and supplements including opioid prescriptions. Patient is not currently taking opioid prescriptions. Functional ability and status Nutritional status Physical activity Advanced directives List of other physicians Hospitalizations, surgeries, and ER visits in previous 12 months Vitals Screenings to include cognitive, depression, and falls Referrals and appointments  In addition, I have reviewed and discussed with patient certain preventive protocols, quality metrics, and best practice recommendations. A written personalized care plan for preventive services as well as general preventive health recommendations were provided to patient.     Ronal JINNY Hailstone, MD   12/28/2023   After Visit Summary: (In Person-Printed) AVS printed and given to the patient

## 2024-01-11 ENCOUNTER — Other Ambulatory Visit: Payer: Self-pay

## 2024-02-29 ENCOUNTER — Encounter (HOSPITAL_BASED_OUTPATIENT_CLINIC_OR_DEPARTMENT_OTHER): Payer: Self-pay | Admitting: Cardiovascular Disease

## 2024-03-24 ENCOUNTER — Encounter: Payer: Self-pay | Admitting: Internal Medicine

## 2024-03-24 ENCOUNTER — Ambulatory Visit (INDEPENDENT_AMBULATORY_CARE_PROVIDER_SITE_OTHER): Admitting: Internal Medicine

## 2024-03-24 VITALS — BP 130/70 | HR 80 | Ht 61.5 in | Wt 210.0 lb

## 2024-03-24 DIAGNOSIS — M65311 Trigger thumb, right thumb: Secondary | ICD-10-CM | POA: Diagnosis not present

## 2024-03-24 NOTE — Patient Instructions (Addendum)
 Referral to Hand surgeon for evaluation of right trigger thumb.

## 2024-03-24 NOTE — Progress Notes (Addendum)
 Patient Care Team: Perri Ronal PARAS, MD as PCP - General (Internal Medicine) Raford Riggs, MD as PCP - Cardiology (Cardiology)  Visit Date: 03/24/24  Subjective:    Patient ID: Audrey Yoder , Female   DOB: [birth year], 73 y.o.    MRN: [id number]   73 y.o. Female presents today for office visit for right thumb discomfort . Patient has a past medical history of vitamin D  deficiency, Elevated LDL, Uterine Cancer; Kidney stones, CAD.  She said her right thumb has been locking up, which makes moving it very difficult, for several weeks, and that she would like a referral to specialist who can treat it.  Past Medical History:  Diagnosis Date   Allergy    Arthritis    CAD in native artery 03/14/2022   Elevated blood pressure reading 03/14/2022   Endometrial ca [facility])    Hx of colonic polyps 03/31/2016     Family History  Problem Relation Age of Onset   Stroke Mother 56   Atrial fibrillation Mother    Other Father        Miloprolific disease   Other Brother        Miloprolific disease   COPD Paternal Grandfather    Stroke Maternal Uncle    Atrial fibrillation Maternal Uncle    Atrial fibrillation Maternal Cousin    Stroke Maternal Cousin    Colon cancer Neg Hx    Esophageal cancer Neg Hx    Rectal cancer Neg Hx    Stomach cancer Neg Hx     Social History   Social History Narrative   Married, is an elder Social worker and estate attorney   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 Non-smoker, occasional wine consumption. Has 2 adult sons and grandchildren.      Review of Systems  Musculoskeletal:  Positive for joint pain.        Objective:   Vitals: BP 130/70   Pulse 80   Ht 5' 1.5 (1.562 m)   Wt 210 lb (95.3 kg)   SpO2 97%   BMI 39.04 kg/m    Physical Exam Vitals and nursing note reviewed.     Exam demonstrated consistent trigger finger with flexion of right thumb.   Results:     Labs:       Component Value Date/Time   NA 143  12/10/2023 0949   NA 142 07/21/2022 1106   K 5.3 12/10/2023 0949   CL 106 12/10/2023 0949   CO2 29 12/10/2023 0949   GLUCOSE 89 12/10/2023 0949   BUN 24 12/10/2023 0949   BUN 13 07/21/2022 1106   CREATININE 0.86 12/10/2023 0949   CALCIUM  9.3 12/10/2023 0949   PROT 6.5 12/10/2023 0949   PROT 6.3 07/21/2022 1106   ALBUMIN 4.3 07/21/2022 1106   AST 15 12/10/2023 0949   ALT 15 12/10/2023 0949   ALKPHOS 115 07/21/2022 1106   BILITOT 0.5 12/10/2023 0949   BILITOT 0.4 07/21/2022 1106   GFRNONAA 88 11/25/2020 1234   GFRAA 102 11/25/2020 1234     Lab Results  Component Value Date   WBC 4.9 12/10/2023   HGB 14.8 12/10/2023   HCT 45.0 12/10/2023   MCV 94.1 12/10/2023   PLT 256 12/10/2023    Lab Results  Component Value Date   CHOL 180 12/10/2023   HDL 55 12/10/2023   LDLCALC 108 (H) 12/10/2023   TRIG 78 12/10/2023   CHOLHDL 3.3 12/10/2023    Lab Results  Component Value Date   HGBA1C 5.6 12/01/2022     Lab Results  Component Value Date   TSH 0.57 12/10/2023         Assessment & Plan:   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    Referred to Dr. Anush [facility] at [facility].      I,Makayla C Reid,acting as a scribe for Ronal JINNY Hailstone, MD.,have documented all relevant documentation on the behalf of Ronal JINNY Hailstone, MD,as directed by  Ronal JINNY Hailstone, MD while in the presence of Ronal JINNY Hailstone, MD.

## 2024-04-14 ENCOUNTER — Ambulatory Visit: Admitting: Orthopedic Surgery

## 2024-04-14 DIAGNOSIS — M65311 Trigger thumb, right thumb: Secondary | ICD-10-CM | POA: Diagnosis not present

## 2024-04-14 MED ORDER — BETAMETHASONE SOD PHOS & ACET 6 (3-3) MG/ML IJ SUSP
6.0000 mg | INTRAMUSCULAR | Status: AC | PRN
  Administered 2024-04-14: 6 mg via INTRA_ARTICULAR

## 2024-04-14 MED ORDER — LIDOCAINE HCL 1 % IJ SOLN
1.0000 mL | INTRAMUSCULAR | Status: AC | PRN
  Administered 2024-04-14: 1 mL

## 2024-04-14 NOTE — Progress Notes (Signed)
 Audrey Yoder - 73 y.o. female MRN [id number]  Date of birth: Nov 15, 1950  Office Visit Note: Visit Date: 04/14/2024 PCP: Perri Ronal PARAS, MD Referred by: Perri Ronal PARAS, MD  Subjective: No chief complaint on file.  HPI: Audrey Yoder is a pleasant 73 y.o. female who presents today for right trigger thumb. States this has been going on for over a month and getting worse. She has a brace for it that she wears to help stabilize the thumb which does help.  Does have occasional locking requiring manual correction.  No prior history of trigger digits.  She is overall healthy and active at baseline.  Pertinent ROS were reviewed with the patient and found to be negative unless otherwise specified above in HPI.   Visit Reason: right thumb trigger Duration of symptoms: 1+ month Hand dominance: right Occupation: attorney Diabetic: No Smoking: No Heart/Lung History: none Blood Thinners: none  Prior Testing/EMG: none Injections (Date): none Treatments: brace Prior Surgery: none    Assessment & Plan: Visit Diagnoses:  1. Trigger thumb, right thumb     Plan: Extensive discussion was had with the patient today regarding her right thumb trigger digit.  We discussed the etiology and pathophysiology of stenosing tenosynovitis.  We discussed conservative versus surgical treatment modalities.  From a conservative standpoint, we discussed activity modification, splinting, therapy and injections.  From a surgical standpoint, we discussed the possibility for trigger digit release as well as all risk and benefits associated.  Given that she has not trialed conservative treatments outside of trigger splinting, patient is appropriate candidate for cortisone injection to the right thumb A1 pulley for symptom relief.  Risks and benefit of the cortisone injection were discussed in detail, patient agreed to proceed.  Injection was provided today without issue, patient will return in approximate 6 weeks  time for a recheck.   Follow-up: No follow-ups on file.   Meds & Orders: No orders of the defined types were placed in this encounter.  No orders of the defined types were placed in this encounter.    Procedures: Hand/UE Inj: R thumb A1 for trigger finger on 04/14/2024 10:46 AM Indications: pain Details: 25 G needle, volar approach Medications: 1 mL lidocaine 1 %; 6 mg betamethasone acetate-betamethasone sodium phosphate 6 (3-3) MG/ML Consent was given by the patient. Patient was prepped and draped in the usual sterile fashion.          Clinical History: No specialty comments available.  She reports that she has never smoked. She has never used smokeless tobacco. No results for input(s): HGBA1C, LABURIC in the last 8760 hours.  Objective:   Vital Signs: There were no vitals taken for this visit.  Physical Exam  Gen: Well-appearing, in no acute distress; non-toxic CV: Regular Rate. Well-perfused. Warm.  Resp: Breathing unlabored on room air; no wheezing. Psych: Fluid speech in conversation; appropriate affect; normal thought process  Ortho Exam Right hand: - Palpable nodule at the A1 pulley of the thumb, associated tenderness - Notable clicking with deep flexion of the thumb, there is evidence of significant locking with deep flexion - Sensation intact distally, hand remains warm well-perfused    Imaging: No results found.  Past Medical/Family/Surgical/Social History: Medications & Allergies reviewed per EMR, new medications updated. Patient Active Problem List   Diagnosis Date Noted   Elevated blood pressure reading 03/14/2022   CAD in native artery 03/14/2022   Hx of colonic polyps 03/31/2016   Kidney stones 06/15/2014   History of  vitamin D  deficiency 01/13/2012   Elevated LDL cholesterol level 01/13/2012   History of uterine cancer 01/13/2012   Obesity 12/29/2010   Past Medical History:  Diagnosis Date   Allergy    Arthritis    CAD in native artery  03/14/2022   Elevated blood pressure reading 03/14/2022   Endometrial ca [facility])    Hx of colonic polyps 03/31/2016   Family History  Problem Relation Age of Onset   Stroke Mother 78   Atrial fibrillation Mother    Other Father        Miloprolific disease   Other Brother        Miloprolific disease   COPD Paternal Grandfather    Stroke Maternal Uncle    Atrial fibrillation Maternal Uncle    Atrial fibrillation Maternal Cousin    Stroke Maternal Cousin    Colon cancer Neg Hx    Esophageal cancer Neg Hx    Rectal cancer Neg Hx    Stomach cancer Neg Hx    Past Surgical History:  Procedure Laterality Date   ABDOMINAL HYSTERECTOMY     TOTAL VAGINAL HYSTERECTOMY     Social History   Occupational History   Not on file  Tobacco Use   Smoking status: Never   Smokeless tobacco: Never  Vaping Use   Vaping status: Never Used  Substance and Sexual Activity   Alcohol use: Yes    Alcohol/week: 2.0 standard drinks of alcohol    Types: 2 Glasses of wine per week    Comment: one every 6 or 7 months   Drug use: No   Sexual activity: Not on file    Shamiracle Gorden Estela) Arlinda, M.D. [facility], Hand Surgery

## 2024-04-21 ENCOUNTER — Encounter: Payer: Self-pay | Admitting: Radiology

## 2024-05-27 ENCOUNTER — Ambulatory Visit: Admitting: Orthopedic Surgery

## 2024-08-06 ENCOUNTER — Ambulatory Visit: Admitting: Orthopedic Surgery
